# Patient Record
Sex: Male | Born: 1955 | Race: White | Hispanic: No | Smoking: Current every day smoker
Health system: Southern US, Community
[De-identification: ages and names within clinical notes are randomized; demographics above are authoritative.]

## PROBLEM LIST (undated history)

## (undated) DIAGNOSIS — F329 Major depressive disorder, single episode, unspecified: Secondary | ICD-10-CM

## (undated) DIAGNOSIS — F32A Depression, unspecified: Secondary | ICD-10-CM

## (undated) DIAGNOSIS — M1990 Unspecified osteoarthritis, unspecified site: Secondary | ICD-10-CM

## (undated) DIAGNOSIS — C801 Malignant (primary) neoplasm, unspecified: Secondary | ICD-10-CM

## (undated) DIAGNOSIS — Z87440 Personal history of urinary (tract) infections: Secondary | ICD-10-CM

## (undated) DIAGNOSIS — Z8619 Personal history of other infectious and parasitic diseases: Secondary | ICD-10-CM

## (undated) DIAGNOSIS — H905 Unspecified sensorineural hearing loss: Secondary | ICD-10-CM

## (undated) DIAGNOSIS — F17200 Nicotine dependence, unspecified, uncomplicated: Secondary | ICD-10-CM

## (undated) HISTORY — DX: Depression, unspecified: F32.A

## (undated) HISTORY — DX: Personal history of other infectious and parasitic diseases: Z86.19

## (undated) HISTORY — DX: Unspecified osteoarthritis, unspecified site: M19.90

## (undated) HISTORY — DX: Unspecified sensorineural hearing loss: H90.5

## (undated) HISTORY — DX: Major depressive disorder, single episode, unspecified: F32.9

## (undated) HISTORY — PX: EYE SURGERY: SHX253

## (undated) HISTORY — DX: Personal history of urinary (tract) infections: Z87.440

## (undated) HISTORY — DX: Nicotine dependence, unspecified, uncomplicated: F17.200

---

## 2001-11-19 ENCOUNTER — Ambulatory Visit (HOSPITAL_COMMUNITY): Admission: RE | Admit: 2001-11-19 | Discharge: 2001-11-19 | Payer: Self-pay | Admitting: Urology

## 2004-11-30 ENCOUNTER — Ambulatory Visit: Payer: Self-pay | Admitting: Family Medicine

## 2005-02-23 ENCOUNTER — Ambulatory Visit: Payer: Self-pay | Admitting: Internal Medicine

## 2005-06-15 ENCOUNTER — Ambulatory Visit: Payer: Self-pay | Admitting: Family Medicine

## 2005-08-22 ENCOUNTER — Ambulatory Visit: Payer: Self-pay | Admitting: Family Medicine

## 2005-08-28 ENCOUNTER — Ambulatory Visit: Payer: Self-pay | Admitting: Family Medicine

## 2005-10-20 ENCOUNTER — Ambulatory Visit: Payer: Self-pay | Admitting: Family Medicine

## 2006-06-26 ENCOUNTER — Ambulatory Visit: Payer: Self-pay | Admitting: Family Medicine

## 2008-09-11 HISTORY — PX: CATARACT EXTRACTION: SUR2

## 2009-10-01 ENCOUNTER — Telehealth: Payer: Self-pay | Admitting: Family Medicine

## 2010-09-11 DIAGNOSIS — H903 Sensorineural hearing loss, bilateral: Secondary | ICD-10-CM

## 2010-09-11 HISTORY — DX: Sensorineural hearing loss, bilateral: H90.3

## 2010-10-11 NOTE — Progress Notes (Signed)
Summary: ?ear infection  Phone Note Call from Patient Call back at cell [phone] or home [phone]   Caller: Patient Call For: Dr Dayton Martes Summary of Call: Pt has not been seen since 2007. Pt has been going to dr at [facility] in [place] because there was no charge. Now the [place] policy has changed and pt does not want to go to [facility] he wants to start coming here. 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 Pt want ed something done for him today. I checked with [place] and she said we could send a phone note to Dr. Dayton Martes with no guarantees. For 3 weeks pt has had head congestion on rt side of head, drainage down back of throat but no sorethroat, feels like fluid in rt ear and pt said he has ear infedtion. Pt uses [facility]. [phone] if pharmacy needed. Pt would like call back ASAP. Please advise.  Initial call taken by: Lewanda Rife LPN,  October 01, 2009 4:11 PM  Follow-up for Phone Call        I would schedule him in Saturday clinlic for ACUTE issues only.  That's my opinoin but you may want to run it by someone else. Follow-up by: Ruthe Mannan MD,  October 01, 2009 4:15 PM  Additional Follow-up for Phone Call Additional follow up Details #1::        I checked with Aram Beecham in business office and she said OK to let pt know he can go to Urgent care for his needs today. Pt said he knew he could go to urgent care and appreciated me calling him back.Lewanda Rife LPN  October 01, 2009 4:26 PM

## 2010-12-11 HISTORY — PX: MR BRAIN LTD W/O CM: HXRAD137

## 2010-12-26 ENCOUNTER — Ambulatory Visit: Payer: Self-pay | Admitting: Otolaryngology

## 2011-01-27 NOTE — Procedures (Signed)
[address]  Patient:    Adam Boyd, Adam Boyd Visit Number: [id number] MRN: [id number]          Service Type: Attending:  Vonzell Schlatter. Patsi Sears, M.D. Dictated by:   Vonzell Schlatter Patsi Sears, M.D. Proc. Date: 11/20/01   CC:         [facility]   Procedure Report  PREOPERATIVE DIAGNOSIS:  Erectile dysfunction.  POSTOPERATIVE DIAGNOSIS:  Erectile dysfunction.  OPERATION:  Vascular laboratory evaluation with prostaglandin injection.  SURGEON:  Sigmund I. Patsi Sears, M.D.  ANESTHESIA:  None.  PROCEDURE:  With the patient in the supine position, preinjection cavernosal arterial identification was accomplished with a peak systolic flow of 30 cm/second on the right side and 38 cm/second on the left side with a 10 cm end-diastolic flow on the right and 12 cm end-diastolic flow on the left.  The patient was injected with 1.0 cc of 20 mcg/cc prostaglandin, and developed a peak flow of 57 cm/second 15 minutes postinjection, but with a 15 cm end-diastolic flow (right side); and a peak flow of 52 cm/second on the left side with an end-diastolic flow of only 8 cm/second.  The patient was not noted to have any particular erection, will be followed up in the office for quality of erection with the injection.  He appeared to have some venous leak, but does have reasonable arterial inflow. Dictated by:   Vonzell Schlatter Patsi Sears, M.D. Attending:  Vonzell Schlatter. Patsi Sears, M.D. DD:  11/20/01 TD:  11/20/01 Job: [id number]

## 2011-03-10 ENCOUNTER — Ambulatory Visit (INDEPENDENT_AMBULATORY_CARE_PROVIDER_SITE_OTHER): Payer: 59 | Admitting: Family Medicine

## 2011-03-10 ENCOUNTER — Ambulatory Visit (INDEPENDENT_AMBULATORY_CARE_PROVIDER_SITE_OTHER)
Admission: RE | Admit: 2011-03-10 | Discharge: 2011-03-10 | Disposition: A | Discharge status: Home / Self Care | Payer: 59 | Source: Ambulatory Visit | Attending: Family Medicine | Admitting: Family Medicine

## 2011-03-10 ENCOUNTER — Encounter: Payer: Self-pay | Admitting: Family Medicine

## 2011-03-10 VITALS — BP 110/64 | HR 72 | Temp 98.8°F | Ht 66.5 in | Wt 151.2 lb

## 2011-03-10 DIAGNOSIS — W57XXXA Bitten or stung by nonvenomous insect and other nonvenomous arthropods, initial encounter: Secondary | ICD-10-CM

## 2011-03-10 DIAGNOSIS — R05 Cough: Secondary | ICD-10-CM

## 2011-03-10 DIAGNOSIS — J449 Chronic obstructive pulmonary disease, unspecified: Secondary | ICD-10-CM

## 2011-03-10 DIAGNOSIS — F17200 Nicotine dependence, unspecified, uncomplicated: Secondary | ICD-10-CM

## 2011-03-10 DIAGNOSIS — M1990 Unspecified osteoarthritis, unspecified site: Secondary | ICD-10-CM

## 2011-03-10 DIAGNOSIS — Z0000 Encounter for general adult medical examination without abnormal findings: Secondary | ICD-10-CM

## 2011-03-10 DIAGNOSIS — T148XXA Other injury of unspecified body region, initial encounter: Secondary | ICD-10-CM

## 2011-03-10 DIAGNOSIS — Z125 Encounter for screening for malignant neoplasm of prostate: Secondary | ICD-10-CM

## 2011-03-10 DIAGNOSIS — Z23 Encounter for immunization: Secondary | ICD-10-CM

## 2011-03-10 DIAGNOSIS — R5383 Other fatigue: Secondary | ICD-10-CM

## 2011-03-10 DIAGNOSIS — M129 Arthropathy, unspecified: Secondary | ICD-10-CM

## 2011-03-10 NOTE — Progress Notes (Signed)
Subjective:    Patient ID: Adam Boyd, male    DOB: Aug 16, 1956, 55 y.o.   MRN: [id number]  HPI CC: new patient  Previously saw Dr. Hetty Ely, hasn't been here since 2007.  Was seen at [facility] for this then made too much money so they were no longer able to see him.  Bad back - takes tylenol tid.  Has had ESI.  Had discussed surgery in past, then lost [facility] insurance.  Hearing loss - 07/2010 - R ear hearing loss suddenly down to 20%.  Saw [facility] 10/2010, treated as sinus infection.  Didn't help.  Saw ENT at [facility] - s/p 2 rounds prednisone then MRI last month WNL per patient.  Still not hearing well.  Will request records.  Also has had multiple tick bites recently, last 3 1/2 mo ago.  Did have rash at site, none otherwise.  Feeling more fatigued.  Occasional fevers/chill and flu sxs x 1 1/2 mo.  Endorses neck pain and muscle pains recently.  Requests to be tested for lyme disease.  Smoking - 1 ppd, thought about cutting back.  Wife smokes too.  trying to quit.  Preventative:  No recent tetanus shot.  Requests Tdap today. No recent physical, due for one. (5 years ago) No colonoscopy.  No prostate check recently.  Would like PSA checked.  Medications and allergies reviewed and updated in chart. Patient Active Problem List  Diagnoses  . COPD (chronic obstructive pulmonary disease)   Past Medical History  Diagnosis Date  . Arthritis     bad back, pinched nerve  . History of chicken pox   . Depression   . Hx: UTI (urinary tract infection)   . Smoker    Past Surgical History  Procedure Date  . 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 Cataract extraction     bilateral, 5 years ago   History  Substance Use Topics  . Smoking status: Current Everyday Smoker -- 1.0 packs/day    Types: Cigarettes  . Smokeless tobacco: Never Used  . Alcohol Use: Yes     1-2 beers/day   Family History  Problem Relation Age of Onset  . Hypertension Mother   . Diabetes Mother   . Stroke  Paternal Grandmother   . Coronary artery disease Neg Hx   . Cancer Neg Hx    No Known Allergies No current outpatient prescriptions on file prior to visit.   Review of Systems  Constitutional: Positive for fever and unexpected weight change (~15 lb weight loss (3 mo)). Negative for chills, activity change, appetite change and fatigue.  HENT: Negative for hearing loss and neck pain.   Eyes: Negative for visual disturbance.  Respiratory: Positive for cough and wheezing. Negative for chest tightness and shortness of breath.   Cardiovascular: Negative for chest pain, palpitations and leg swelling.  Gastrointestinal: Negative for nausea, vomiting, abdominal pain, diarrhea, constipation, blood in stool and abdominal distention.  Genitourinary: Negative for hematuria and difficulty urinating.  Musculoskeletal: Negative for myalgias and arthralgias.  Skin: Negative for rash.  Neurological: Negative for dizziness, seizures, syncope and headaches.  Hematological: Does not bruise/bleed easily.  Psychiatric/Behavioral: Negative for dysphoric mood. The patient is not nervous/anxious.        Objective:   Physical Exam  Nursing note and vitals reviewed. Constitutional: He is oriented to person, place, and time. He appears well-developed and well-nourished. No distress.  HENT:  Head: Normocephalic and atraumatic.  Right Ear: External  ear normal.  Left Ear: External ear normal.  Nose: Nose normal.  Mouth/Throat: Oropharynx is clear and moist.  Eyes: Conjunctivae and EOM are normal. Pupils are equal, round, and reactive to light.  Neck: Normal range of motion. Neck supple. No thyromegaly present.  Cardiovascular: Normal rate, regular rhythm, normal heart sounds and intact distal pulses.   No murmur heard. Pulses:      Radial pulses are 2+ on the right side, and 2+ on the left side.  Pulmonary/Chest: Effort normal and breath sounds normal. No respiratory distress. He has no wheezes. He has no  rales.  Abdominal: Soft. Bowel sounds are normal. He exhibits no distension and no mass. There is no tenderness. There is no rebound and no guarding.  Musculoskeletal: Normal range of motion.  Lymphadenopathy:    He has no cervical adenopathy.  Neurological: He is alert and oriented to person, place, and time.       CN grossly intact, station and gait intact  Skin: Skin is warm and dry. No rash noted.  Psychiatric: He has a normal mood and affect. His behavior is normal. Judgment and thought content normal.          Assessment & Plan:

## 2011-03-10 NOTE — Patient Instructions (Signed)
Return at your convenience fasting for blood work (we will check for lyme disease then as well). Return afterwards for complete physical. Try to get records from [place].  I will request from [facility]. Good to meet you today, call us with questions. Try to cut back on smoking in meantime.

## 2011-03-12 ENCOUNTER — Encounter: Payer: Self-pay | Admitting: Family Medicine

## 2011-03-12 DIAGNOSIS — F17200 Nicotine dependence, unspecified, uncomplicated: Secondary | ICD-10-CM | POA: Insufficient documentation

## 2011-03-12 DIAGNOSIS — Z0000 Encounter for general adult medical examination without abnormal findings: Secondary | ICD-10-CM | POA: Insufficient documentation

## 2011-03-12 DIAGNOSIS — M1990 Unspecified osteoarthritis, unspecified site: Secondary | ICD-10-CM | POA: Insufficient documentation

## 2011-03-12 DIAGNOSIS — W57XXXA Bitten or stung by nonvenomous insect and other nonvenomous arthropods, initial encounter: Secondary | ICD-10-CM | POA: Insufficient documentation

## 2011-03-12 NOTE — Assessment & Plan Note (Signed)
In chart but pt did not mention. CXR - hyperinflation.

## 2011-03-12 NOTE — Assessment & Plan Note (Signed)
Tdap today. Pt requests PSA. Will return fasting for blood work, afterwards for CPE.

## 2011-03-12 NOTE — Assessment & Plan Note (Signed)
And weight loss endorsed.  Checked CXR - nothing acute, no mass.  + hyperinflation.  Likely COPD.

## 2011-03-12 NOTE — Assessment & Plan Note (Addendum)
Given tick bites and sxs longstanding x 1 1/2 mo with myalgia, fatigue, will check lyme titers.

## 2011-03-12 NOTE — Assessment & Plan Note (Signed)
Takes tylenol for this.  [facility]. Pt will attempt to obtain records from [place] for our chart.

## 2011-03-12 NOTE — Assessment & Plan Note (Signed)
Discussed cessation.  Pt contemplative.

## 2011-03-26 ENCOUNTER — Encounter: Payer: Self-pay | Admitting: Family Medicine

## 2011-09-14 ENCOUNTER — Encounter: Payer: Self-pay | Admitting: Family Medicine

## 2011-09-14 ENCOUNTER — Ambulatory Visit (INDEPENDENT_AMBULATORY_CARE_PROVIDER_SITE_OTHER): Payer: 59 | Admitting: Family Medicine

## 2011-09-14 VITALS — BP 122/70 | HR 77 | Temp 97.9°F | Wt 148.0 lb

## 2011-09-14 DIAGNOSIS — M549 Dorsalgia, unspecified: Secondary | ICD-10-CM

## 2011-09-14 MED ORDER — TRAMADOL HCL 50 MG PO TABS: 50.0000 mg | ORAL_TABLET | Freq: Three times a day (TID) | ORAL | Status: DC | PRN

## 2011-09-14 NOTE — Progress Notes (Signed)
Back pain.  Midline back pain, above the belt.  Going on for years.  Had prev "DDD and pinched nerve" after eval at [facility] years ago.  Inc in pain recently, in last 2-3 weeks. He doesn't remember last day w/o pain.  No known injury to set off sx inc 2-3 weeks ago.  He has to roll over and over all night due to pain.  No leg pain but pain near R SI joint. No foot drop.  Taking tylenol for pain.  Pain better if sitting.  Numbness on bottom of feet, intermittently.  He prev had back injected via the [place], had temp relief.    Meds, vitals, and allergies reviewed.   ROS: See HPI.  Otherwise, noncontributory.  nad ncat Back w/o midline pain in T spine Midline back: bulge noted in L spine with scoliotic changes, midline ttp in L spine Dec in DTR at R patella but normal DTRs in BLE o/w.  SLR neg.  Sensation intact and gross motor exam intact for BLE.

## 2011-09-14 NOTE — Patient Instructions (Addendum)
See Shirlee Limerick about your referral before you leave today. Take no more than 8 tylenol in a day. Take 1-2 aleve up to twice a day with food. Take tramadol for pain.  It can make you drowsy.   Take care.

## 2011-09-15 ENCOUNTER — Telehealth: Payer: Self-pay | Admitting: Family Medicine

## 2011-09-15 DIAGNOSIS — M549 Dorsalgia, unspecified: Secondary | ICD-10-CM | POA: Insufficient documentation

## 2011-09-15 NOTE — Telephone Encounter (Signed)
LMOVM

## 2011-09-15 NOTE — Assessment & Plan Note (Addendum)
Use tramadol, nsaids with gi caution and limit tylenol.  See phone note re: referral.  I have d/w PMD.

## 2011-09-15 NOTE — Telephone Encounter (Signed)
Call pt.  If he can't get [facility] with [facility], then have him let us know what we can do to get him in with local neurosurg.  He has abnormal reflex on R leg and I don't want this to get worse, to the point of weakness.  The tramadol may help with pain, but this isn't a long term solution.

## 2011-12-05 ENCOUNTER — Ambulatory Visit (INDEPENDENT_AMBULATORY_CARE_PROVIDER_SITE_OTHER): Payer: 59 | Admitting: Family Medicine

## 2011-12-05 ENCOUNTER — Encounter: Payer: Self-pay | Admitting: Family Medicine

## 2011-12-05 VITALS — BP 122/70 | HR 60 | Temp 98.7°F | Wt 148.0 lb

## 2011-12-05 DIAGNOSIS — M549 Dorsalgia, unspecified: Secondary | ICD-10-CM

## 2011-12-05 MED ORDER — PREDNISONE 5 MG PO KIT: PACK | ORAL | Status: DC

## 2011-12-05 MED ORDER — TRAMADOL HCL 50 MG PO TABS: 50.0000 mg | ORAL_TABLET | Freq: Three times a day (TID) | ORAL | Status: AC | PRN

## 2011-12-05 NOTE — Progress Notes (Signed)
Low back pain with burning in the buttocks and dec in sensation on bottom of feet and along the shaft of the penis.  Going on for years, pain is worse recently.  Some help with tramadol, unless he's at rest.  Pain is worse standing, can improve sitting.  He's asking about options.  He's awaiting financial changes to allow him to get f/u at the [place].    Prev note reviewed.   Meds, vitals, and allergies reviewed.   ROS: See HPI.  Otherwise, noncontributory.  nad  ncat  Back w/o midline pain in T spine  Midline back: bulge noted in L spine with scoliotic changes, midline ttp in L spine  Dec in DTR at R patella but normal DTRs in BLE o/w.  SLR neg. Sensation intact and gross motor exam intact for BLE.  His exam is unchanged from prev.

## 2011-12-05 NOTE — Patient Instructions (Signed)
Take the prednisone with food, take the tramadol for pain, and let me know if you need help setting up the consult with the [facility].  Take care.

## 2011-12-06 ENCOUNTER — Encounter: Payer: Self-pay | Admitting: Family Medicine

## 2011-12-06 NOTE — Assessment & Plan Note (Signed)
Will add steroid taper with GI caution to see if he has any relief.  I've talked with Shirlee Limerick and she'll check with patient about neurosurg referral.  His exam is unchanged but I don't want him to progress.

## 2011-12-13 ENCOUNTER — Telehealth: Payer: Self-pay | Admitting: Family Medicine

## 2011-12-13 NOTE — Telephone Encounter (Signed)
Have called the patient and have left several messages on his machine to call me back to help set up Neurosurgery consult. After multiple attempts for contact we will wait to hear back from the patient to help with referral to Neurosurgery per Dr Para March.

## 2012-02-14 ENCOUNTER — Ambulatory Visit (INDEPENDENT_AMBULATORY_CARE_PROVIDER_SITE_OTHER): Payer: 59 | Admitting: Family Medicine

## 2012-02-14 ENCOUNTER — Encounter: Payer: Self-pay | Admitting: Family Medicine

## 2012-02-14 VITALS — BP 130/70 | HR 76 | Temp 98.1°F | Ht 66.75 in | Wt 144.5 lb

## 2012-02-14 DIAGNOSIS — M549 Dorsalgia, unspecified: Secondary | ICD-10-CM

## 2012-02-14 MED ORDER — CYCLOBENZAPRINE HCL 10 MG PO TABS: 5.0000 mg | ORAL_TABLET | Freq: Every evening | ORAL | Status: AC | PRN

## 2012-02-14 MED ORDER — TRAMADOL HCL 50 MG PO TABS: 50.0000 mg | ORAL_TABLET | Freq: Two times a day (BID) | ORAL | Status: AC

## 2012-02-14 NOTE — Progress Notes (Signed)
We had tried to call pt mult times prev.  See prev notes.    Yesterday he was working on [employer], was unloading some cabinets.  Pain got worse and legs felt diffusely weak and fatigued.  This happens when he has a flare of pain.   He still has low back pain with burning in the buttocks and dec in sensation on bottom of feet and along the shaft of the penis. Going on for years.  Some help with tramadol, unless he's at rest and the pain is then better w/o meds.  Has run out of tramadol. Pain is worse standing, can improve sitting.  He's still awaiting financial changes to allow him to get f/u at the [place].  He'll let me know about what he needs and when he needs it in terms of the referral.    He's also having nocturnal L>R hamstring cramps.   He's been taking aspirin and tylenol.  Tramadol helped prev; he realized it was helping more after he started it.    Mult family members have been sick recently with a cough.  Some cough, prev with fevers that have resolved. Cough is getting better slowly.    Prev note reviewed.   Meds, vitals, and allergies reviewed.   ROS: See HPI. Otherwise, noncontributory.   nad  ncat  Mmm rrr No inc in wob but with diffuse scant coarse BS and scattered rhonchi w/o focal dec in BS Back w/o midline pain in T spine  Midline back: bulge noted in L spine with scoliotic changes, midline ttp in L spine  Slight dec in DTR at R patella but normal DTRs in ext x4 o/w.  SLR neg. Sensation intact and gross motor exam intact for BLE.  His exam is unchanged from prev.

## 2012-02-14 NOTE — Patient Instructions (Signed)
Take the tramadol for pain and use the flexeril/cyclobenzaprine for spasms at night.  Let me know if you have leg weakness or help with the referral.  Take care.

## 2012-02-14 NOTE — Assessment & Plan Note (Signed)
Will restart tramadol and add on flexeril for night time cramps.  Sedation caution given.  He understood. I'll await his input on referral.  He has no changes in exam and is not apparently an urgent/emergent case.  He agrees with plan.  If progression, changes, weakness, then he'll notify me.

## 2017-08-11 ENCOUNTER — Encounter: Payer: Self-pay | Admitting: Emergency Medicine

## 2017-08-11 ENCOUNTER — Emergency Department: Payer: Medicaid Other

## 2017-08-11 ENCOUNTER — Other Ambulatory Visit: Payer: Self-pay

## 2017-08-11 ENCOUNTER — Inpatient Hospital Stay
Admission: EM | Admit: 2017-08-11 | Discharge: 2017-08-21 | DRG: 177 | Disposition: A | Discharge status: Home / Self Care | Payer: Medicaid Other | Attending: Internal Medicine | Admitting: Internal Medicine

## 2017-08-11 DIAGNOSIS — M4690 Unspecified inflammatory spondylopathy, site unspecified: Secondary | ICD-10-CM | POA: Diagnosis present

## 2017-08-11 DIAGNOSIS — F10239 Alcohol dependence with withdrawal, unspecified: Secondary | ICD-10-CM | POA: Diagnosis not present

## 2017-08-11 DIAGNOSIS — Z23 Encounter for immunization: Secondary | ICD-10-CM

## 2017-08-11 DIAGNOSIS — F17210 Nicotine dependence, cigarettes, uncomplicated: Secondary | ICD-10-CM | POA: Diagnosis present

## 2017-08-11 DIAGNOSIS — Z9842 Cataract extraction status, left eye: Secondary | ICD-10-CM | POA: Diagnosis not present

## 2017-08-11 DIAGNOSIS — E860 Dehydration: Secondary | ICD-10-CM | POA: Diagnosis present

## 2017-08-11 DIAGNOSIS — E43 Unspecified severe protein-calorie malnutrition: Secondary | ICD-10-CM | POA: Diagnosis present

## 2017-08-11 DIAGNOSIS — E222 Syndrome of inappropriate secretion of antidiuretic hormone: Secondary | ICD-10-CM | POA: Diagnosis present

## 2017-08-11 DIAGNOSIS — R918 Other nonspecific abnormal finding of lung field: Secondary | ICD-10-CM | POA: Diagnosis present

## 2017-08-11 DIAGNOSIS — H905 Unspecified sensorineural hearing loss: Secondary | ICD-10-CM | POA: Diagnosis present

## 2017-08-11 DIAGNOSIS — B954 Other streptococcus as the cause of diseases classified elsewhere: Secondary | ICD-10-CM | POA: Diagnosis present

## 2017-08-11 DIAGNOSIS — Z87440 Personal history of urinary (tract) infections: Secondary | ICD-10-CM

## 2017-08-11 DIAGNOSIS — Z09 Encounter for follow-up examination after completed treatment for conditions other than malignant neoplasm: Secondary | ICD-10-CM

## 2017-08-11 DIAGNOSIS — Z9841 Cataract extraction status, right eye: Secondary | ICD-10-CM | POA: Diagnosis not present

## 2017-08-11 DIAGNOSIS — Z681 Body mass index (BMI) 19 or less, adult: Secondary | ICD-10-CM

## 2017-08-11 DIAGNOSIS — J869 Pyothorax without fistula: Secondary | ICD-10-CM | POA: Diagnosis present

## 2017-08-11 DIAGNOSIS — Z8619 Personal history of other infectious and parasitic diseases: Secondary | ICD-10-CM

## 2017-08-11 DIAGNOSIS — Q173 Other misshapen ear: Secondary | ICD-10-CM

## 2017-08-11 DIAGNOSIS — J189 Pneumonia, unspecified organism: Secondary | ICD-10-CM

## 2017-08-11 LAB — COMPREHENSIVE METABOLIC PANEL
ALBUMIN: 2.3 g/dL — AB (ref 3.5–5.0)
ALT: 53 U/L (ref 17–63)
ANION GAP: 12 (ref 5–15)
AST: 113 U/L — AB (ref 15–41)
Alkaline Phosphatase: 110 U/L (ref 38–126)
BUN: 13 mg/dL (ref 6–20)
CO2: 24 mmol/L (ref 22–32)
Calcium: 8.2 mg/dL — ABNORMAL LOW (ref 8.9–10.3)
Chloride: 92 mmol/L — ABNORMAL LOW (ref 101–111)
Creatinine, Ser: 0.79 mg/dL (ref 0.61–1.24)
GFR calc Af Amer: >60 mL/min (ref >60)
GFR calc non Af Amer: >60 mL/min (ref >60)
GLUCOSE: 126 mg/dL — AB (ref 65–99)
POTASSIUM: 3.6 mmol/L (ref 3.5–5.1)
SODIUM: 128 mmol/L — AB (ref 135–145)
Total Bilirubin: 0.8 mg/dL (ref 0.3–1.2)
Total Protein: 7.8 g/dL (ref 6.5–8.1)

## 2017-08-11 LAB — CBC WITH DIFFERENTIAL/PLATELET
BASOS ABS: 0 10*3/uL (ref 0–0.1)
Basophils Relative: 0 %
EOS ABS: 0 10*3/uL (ref 0–0.7)
Eosinophils Relative: 0 %
HCT: 35.5 % — ABNORMAL LOW (ref 40.0–52.0)
HEMOGLOBIN: 12.1 g/dL — AB (ref 13.0–18.0)
LYMPHS ABS: 1 10*3/uL (ref 1.0–3.6)
Lymphocytes Relative: 8 %
MCH: 32.3 pg (ref 26.0–34.0)
MCHC: 34.1 g/dL (ref 32.0–36.0)
MCV: 94.9 fL (ref 80.0–100.0)
Monocytes Absolute: 1.5 10*3/uL — ABNORMAL HIGH (ref 0.2–1.0)
Monocytes Relative: 12 %
NEUTROS PCT: 80 %
Neutro Abs: 10.1 10*3/uL — ABNORMAL HIGH (ref 1.4–6.5)
Platelets: 674 10*3/uL — ABNORMAL HIGH (ref 150–440)
RBC: 3.75 MIL/uL — AB (ref 4.40–5.90)
RDW: 12.8 % (ref 11.5–14.5)
WBC: 12.6 10*3/uL — AB (ref 3.8–10.6)

## 2017-08-11 LAB — URINALYSIS, COMPLETE (UACMP) WITH MICROSCOPIC
BACTERIA UA: NONE SEEN
Bilirubin Urine: NEGATIVE
GLUCOSE, UA: NEGATIVE mg/dL
Hgb urine dipstick: NEGATIVE
Ketones, ur: 5 mg/dL — AB
Leukocytes, UA: NEGATIVE
Nitrite: NEGATIVE
PROTEIN: 30 mg/dL — AB
Squamous Epithelial / LPF: NONE SEEN
pH: 5 (ref 5.0–8.0)

## 2017-08-11 LAB — TROPONIN I

## 2017-08-11 LAB — BRAIN NATRIURETIC PEPTIDE: B NATRIURETIC PEPTIDE 5: 50 pg/mL (ref 0.0–100.0)

## 2017-08-11 LAB — LACTIC ACID, PLASMA: Lactic Acid, Venous: 1.4 mmol/L (ref 0.5–1.9)

## 2017-08-11 MED ORDER — HEPARIN SODIUM (PORCINE) 5000 UNIT/ML IJ SOLN
5000.0000 [IU] | Freq: Three times a day (TID) | INTRAMUSCULAR | Status: DC
  Administered 2017-08-11 – 2017-08-19 (×20): 5000 [IU] via SUBCUTANEOUS
  Filled 2017-08-11 (×21): qty 1

## 2017-08-11 MED ORDER — ACETAMINOPHEN 650 MG RE SUPP: 650.0000 mg | Freq: Four times a day (QID) | RECTAL | Status: DC | PRN

## 2017-08-11 MED ORDER — GUAIFENESIN ER 600 MG PO TB12
600.0000 mg | ORAL_TABLET | Freq: Two times a day (BID) | ORAL | Status: DC
  Administered 2017-08-11 – 2017-08-21 (×19): 600 mg via ORAL
  Filled 2017-08-11 (×19): qty 1

## 2017-08-11 MED ORDER — NICOTINE 21 MG/24HR TD PT24
21.0000 mg | MEDICATED_PATCH | Freq: Every day | TRANSDERMAL | Status: DC
  Administered 2017-08-11 – 2017-08-21 (×11): 21 mg via TRANSDERMAL
  Filled 2017-08-11 (×12): qty 1

## 2017-08-11 MED ORDER — SODIUM CHLORIDE 0.9 % IV BOLUS (SEPSIS)
1000.0000 mL | Freq: Once | INTRAVENOUS | Status: AC
  Administered 2017-08-11: 1000 mL via INTRAVENOUS

## 2017-08-11 MED ORDER — ACETAMINOPHEN 325 MG PO TABS
650.0000 mg | ORAL_TABLET | Freq: Four times a day (QID) | ORAL | Status: DC | PRN
  Administered 2017-08-12 – 2017-08-20 (×6): 650 mg via ORAL
  Filled 2017-08-11 (×6): qty 2

## 2017-08-11 MED ORDER — LEVOFLOXACIN IN D5W 500 MG/100ML IV SOLN: 500.0000 mg | INTRAVENOUS | Status: DC

## 2017-08-11 MED ORDER — ONDANSETRON HCL 4 MG PO TABS: 4.0000 mg | ORAL_TABLET | Freq: Four times a day (QID) | ORAL | Status: DC | PRN

## 2017-08-11 MED ORDER — PIPERACILLIN-TAZOBACTAM 4.5 G IVPB
4.5000 g | Freq: Once | INTRAVENOUS | Status: DC
  Filled 2017-08-11: qty 100

## 2017-08-11 MED ORDER — ONDANSETRON HCL 4 MG/2ML IJ SOLN: 4.0000 mg | Freq: Four times a day (QID) | INTRAMUSCULAR | Status: DC | PRN

## 2017-08-11 MED ORDER — SODIUM CHLORIDE 0.9 % IV SOLN
Freq: Once | INTRAVENOUS | Status: AC
  Administered 2017-08-11: 23:00:00 via INTRAVENOUS
  Filled 2017-08-11: qty 4.5

## 2017-08-11 MED ORDER — PIPERACILLIN-TAZOBACTAM 3.375 G IVPB
INTRAVENOUS | Status: AC
  Filled 2017-08-11: qty 50

## 2017-08-11 MED ORDER — IOPAMIDOL (ISOVUE-300) INJECTION 61%
75.0000 mL | Freq: Once | INTRAVENOUS | Status: AC | PRN
  Administered 2017-08-11: 75 mL via INTRAVENOUS
  Filled 2017-08-11: qty 75

## 2017-08-11 MED ORDER — SODIUM CHLORIDE 0.9 % IV SOLN
INTRAVENOUS | Status: DC
  Administered 2017-08-11: 21:00:00 via INTRAVENOUS

## 2017-08-11 MED ORDER — LEVOFLOXACIN IN D5W 750 MG/150ML IV SOLN
750.0000 mg | Freq: Once | INTRAVENOUS | Status: AC
  Administered 2017-08-12: 750 mg via INTRAVENOUS
  Filled 2017-08-11 (×3): qty 150

## 2017-08-11 MED ORDER — PIPERACILLIN-TAZOBACTAM 3.375 G IVPB
3.3750 g | Freq: Three times a day (TID) | INTRAVENOUS | Status: DC
  Administered 2017-08-12 – 2017-08-16 (×14): 3.375 g via INTRAVENOUS
  Filled 2017-08-11 (×14): qty 50

## 2017-08-11 MED ORDER — LEVOFLOXACIN IN D5W 750 MG/150ML IV SOLN
750.0000 mg | INTRAVENOUS | Status: DC
  Administered 2017-08-12 – 2017-08-14 (×3): 750 mg via INTRAVENOUS
  Filled 2017-08-11 (×4): qty 150

## 2017-08-11 MED ORDER — INFLUENZA VAC SPLIT QUAD 0.5 ML IM SUSY
0.5000 mL | PREFILLED_SYRINGE | INTRAMUSCULAR | Status: AC
  Administered 2017-08-14: 0.5 mL via INTRAMUSCULAR
  Filled 2017-08-11: qty 0.5

## 2017-08-11 MED ORDER — SODIUM CHLORIDE 0.9 % IV SOLN
INTRAVENOUS | Status: DC
  Administered 2017-08-12 – 2017-08-15 (×7): via INTRAVENOUS

## 2017-08-11 NOTE — ED Notes (Signed)
Admitting MD at bedside.

## 2017-08-11 NOTE — ED Triage Notes (Signed)
Increased cough x 3 weeks. Denies fevers. Speaking full sentences with no apparent res distress in triage.

## 2017-08-11 NOTE — H&P (Addendum)
[facility] NAME: Adam Boyd    MR#:  [id number]  DATE OF BIRTH:  [birth year]  DATE OF ADMISSION:  08/11/2017  PRIMARY CARE PHYSICIAN: [name], MD   REQUESTING/REFERRING PHYSICIAN:   [name] [name], PA-C     CHIEF COMPLAINT:   Chief Complaint  Patient presents with  . Cough    HISTORY OF PRESENT ILLNESS: Adam Boyd  is a 61 y.o. male with a known history of chronic smoking greater than 40 years who is brought in by family due to progressively worsening cough and weight loss.  According to the patient is had a cough ongoing for the past year or more and has progressively gotten worse.  Patient reports that this is a dry cough.  He also has occasional night sweats but no fevers.  He is also complaining of some shortness of breath with exertion.  Patient was seen in the emergency room and had a CT of the chest which showed findings consistent with possible empyema as well as other abnormalities noted in the CT of the chest.   PAST MEDICAL HISTORY:   Past Medical History:  Diagnosis Date  . Arthritis    bad back, pinched nerve  . Asymmetrical sensorineural hearing loss 2012   Right ear (odd appearance), s/p eval by ENT and MRI, never followed up.  will recommend pt f/u.  Marland Kitchen Depression   . History of chicken pox   . Hx: UTI (urinary tract infection)   . Smoker     PAST SURGICAL HISTORY:  Past Surgical History:  Procedure Laterality Date  . CATARACT EXTRACTION     bilateral, 5 years ago  . 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  scattere small foci of hyperintensity (sequela or chronic small vessel ischemia vs demyelinating disorder, mild fluid R mastoid air cells, paranasal sinus disease    SOCIAL HISTORY:  Social History   Tobacco Use  . Smoking status: Current Every Day Smoker    Packs/day: 1.00    Years: 42.00    Pack  years: 42.00    Types: Cigarettes  . Smokeless tobacco: Never Used  Substance Use Topics  . Alcohol use: Yes    Comment: 1-2 beers/day    FAMILY HISTORY:  Family History  Problem Relation Age of Onset  . Hypertension Mother   . Diabetes Mother   . Stroke Paternal Grandmother   . Coronary artery disease Neg Hx   . Cancer Neg Hx     DRUG ALLERGIES: No Known Allergies  REVIEW OF SYSTEMS:   CONSTITUTIONAL: No fever, fatigue or weakness.  EYES: No blurred or double vision.  EARS, NOSE, AND THROAT: No tinnitus or ear pain.  RESPIRATORY: + cough, +shortness of breath, wheezing or hemoptysis.  CARDIOVASCULAR: No chest pain, orthopnea, edema.  GASTROINTESTINAL: No nausea, vomiting, diarrhea or abdominal pain.  GENITOURINARY: No dysuria, hematuria.  ENDOCRINE: No polyuria, nocturia,  HEMATOLOGY: No anemia, easy bruising or bleeding SKIN: No rash or lesion. MUSCULOSKELETAL: No joint pain or arthritis.   NEUROLOGIC: No tingling, numbness, weakness.  PSYCHIATRY: No anxiety or depression.   MEDICATIONS AT HOME:  Prior to Admission medications   Medication Sig Start Date End Date Taking? Authorizing Provider  acetaminophen (TYLENOL) 500 MG tablet Take 1,000 mg by mouth every 6 (six) hours as needed.  03/10/11   Danise Mina,  Garlon Hatchet, MD      PHYSICAL EXAMINATION:   VITAL SIGNS: Blood pressure 125/67, pulse 75, temperature 98.9 F (37.2 C), temperature source Oral, resp. rate 20, height 5\' 7"  (1.702 m), weight 140 lb (63.5 kg), SpO2 95 %.  GENERAL:  61 y.o.-year-old patient lying in the bed with no acute distress.  EYES: Pupils equal, round, reactive to light and accommodation. No scleral icterus. Extraocular muscles intact.  HEENT: Head atraumatic, normocephalic. Oropharynx and nasopharynx clear.  NECK:  Supple, no jugular venous distention. No thyroid enlargement, no tenderness.  LUNGS: + wheezing, occasion rhonchi ,  No use of accessory muscles of respiration.  CARDIOVASCULAR: S1,  S2 normal. No murmurs, rubs, or gallops.  ABDOMEN: Soft, nontender, nondistended. Bowel sounds present. No organomegaly or mass.  EXTREMITIES: No pedal edema, cyanosis, or clubbing.  NEUROLOGIC: Cranial nerves II through XII are intact. Muscle strength 5/5 in all extremities. Sensation intact. Gait not checked.  PSYCHIATRIC: The patient is alert and oriented x 3.  SKIN: No obvious rash, lesion, or ulcer.   LABORATORY PANEL:   CBC Recent Labs  Lab 08/11/17 1624  WBC 12.6*  HGB 12.1*  HCT 35.5*  PLT 674*  MCV 94.9  MCH 32.3  MCHC 34.1  RDW 12.8  LYMPHSABS 1.0  MONOABS 1.5*  EOSABS 0.0  BASOSABS 0.0   ------------------------------------------------------------------------------------------------------------------  Chemistries  Recent Labs  Lab 08/11/17 1624  NA 128*  K 3.6  CL 92*  CO2 24  GLUCOSE 126*  BUN 13  CREATININE 0.79  CALCIUM 8.2*  AST 113*  ALT 53  ALKPHOS 110  BILITOT 0.8   ------------------------------------------------------------------------------------------------------------------ estimated creatinine clearance is 84.9 mL/min (by C-G formula based on SCr of 0.79 mg/dL). ------------------------------------------------------------------------------------------------------------------ No results for input(s): TSH, T4TOTAL, T3FREE, THYROIDAB in the last 72 hours.  Invalid input(s): FREET3   Coagulation profile No results for input(s): INR, PROTIME in the last 168 hours. ------------------------------------------------------------------------------------------------------------------- No results for input(s): DDIMER in the last 72 hours. -------------------------------------------------------------------------------------------------------------------  Cardiac Enzymes Recent Labs  Lab 08/11/17 1624  TROPONINI <0.03   ------------------------------------------------------------------------------------------------------------------ Invalid  input(s): POCBNP  ---------------------------------------------------------------------------------------------------------------  Urinalysis No results found for: COLORURINE, APPEARANCEUR, LABSPEC, PHURINE, GLUCOSEU, HGBUR, BILIRUBINUR, KETONESUR, PROTEINUR, UROBILINOGEN, NITRITE, LEUKOCYTESUR   RADIOLOGY: Dg Chest 2 View  Result Date: 08/11/2017 CLINICAL DATA:  Cough for 6 years. EXAM: CHEST  2 VIEW COMPARISON:  03/10/2011 FINDINGS: Large loculated right lateral pleural effusion, possibly loculated anteriorly as well. Right lung airspace disease and left basilar opacities heart is normal size. No acute bony abnormality. IMPRESSION: Large loculated right pleural effusion. Right lung airspace disease concerning for pneumonia. Left basilar atelectasis or pneumonia. Electronically Signed   By: Rolm Baptise M.D.   On: 08/11/2017 15:09   Ct Chest W Contrast  Result Date: 08/11/2017 CLINICAL DATA:  Increased cough for 3 weeks. EXAM: CT CHEST WITH CONTRAST TECHNIQUE: Multidetector CT imaging of the chest was performed during intravenous contrast administration. CONTRAST:  87mL ISOVUE-300 IOPAMIDOL (ISOVUE-300) INJECTION 61% COMPARISON:  None. FINDINGS: Cardiovascular: Heart size normal. No pericardial effusion. Coronary artery calcification is evident. No thoracic aortic aneurysm. Mediastinum/Nodes: Scattered small to upper normal lymph nodes are seen in the mediastinum. Index subcarinal lymph node measures 10 mm short axis on image 83 series 2. There is no hilar lymphadenopathy. The esophagus has normal imaging features. There is no axillary lymphadenopathy. Lungs/Pleura: Moderate to large loculated right pleural fluid collections shows rim enhancement and contains debris and gas. There is adjacent compressive atelectasis with posterior right upper lobe pulmonary nodules measuring  up to 7 mm. Marked bronchial wall thickening is noted in the lungs bilaterally with airway impaction in tree-in-bud nodularity  in the left lower lobe transitioning into a areas of more confluent airspace opacity in the posterior left base. Upper Abdomen: 6 mm hypervascular lesion noted posterior right liver, too small to characterize. Otherwise unremarkable. Musculoskeletal: Bone windows reveal no worrisome lytic or sclerotic osseous lesions. IMPRESSION: 1. Small to moderate loculated right pleural fluid collection containing debris and gas. Imaging features compatible with empyema. 2. Several small pulmonary nodules posterior right upper lobe. Attention on follow-up imaging recommended. 3. Bronchial wall thickening with small airway impaction, tree-in-bud nodularity and areas of confluent airspace disease in the left lower lobe. Imaging features suggest atypical infection. 4. Areas of collapse/consolidation in the right middle and lower lobes. Electronically Signed   By: Misty Stanley M.D.   On: 08/11/2017 18:09    EKG: Orders placed or performed during the hospital encounter of 08/11/17  . ED EKG  . ED EKG    IMPRESSION AND PLAN: Pt is 61 year old white male with history of smoking presenting with cough and weight loss  1.  Cough and shortness of breath: Due to empyema as well as other underlying lung infection Continue patient on Zosyn and Levaquin Consult pulmonary Also consult CT surgery Obtain sputum cultures   2.  Hyponatremia possibly due to SIADH and or due to dehydration we will give him normal saline If no improvement then look for SIADH as a cause  3.  Nicotine abuse smoking cessation provided 4 minutes spent strongly recommended he stop smoking; nicotine patch will be started    All the records are reviewed and case discussed with ED provider. Management plans discussed with the patient, family and they are in agreement.  CODE STATUS: Code Status History    This patient does not have a recorded code status. Please follow your organizational policy for patients in this situation.       TOTAL  TIME TAKING CARE OF THIS PATIENT:55 minutes.    Dustin Flock M.D on 08/11/2017 at 7:19 PM  Between 7am to 6pm - Pager - [phone]  After 6pm go to [email or web address] - password [email or web address] [facility]  [facility]  Office  [phone]  CC: Primary care physician; [name], MD

## 2017-08-11 NOTE — ED Provider Notes (Signed)
[facility] Emergency Department Provider Note  ____________________________________________  Time seen: Approximately 4:08 PM  I have reviewed the triage vital signs and the nursing notes.   HISTORY  Chief Complaint Cough    HPI Adam Boyd is a 60 y.o. male who presents the emergency department for complaint of acute on chronic cough.  Patient is a very poor historian and unable to tell me why he is in the emergency department.  Patient is accompanied by his family members.  Family members provide most of the details for patient's HPI.  Patient has had a multiple year history of worsening cough.  Patient has lost approximately 45 pounds within the past year.  Over the past several weeks, the patient's coughing has worsened and is productive.  No fevers or chills, body aches, URI symptoms.  Patient has become increasingly weak, complaining of pain in his chest with coughing, with increased coughing and productive coughing over the past several weeks.  Patient has a significant 40+ year smoking history.  Patient is still a current smoker.  Patient does not seek care from healthcare providers unless absolutely necessary seen a healthcare provider in the last 10 years.  No history of COPD or emphysema.  No history of pneumonia.  No complaints of headache, visual changes, abdominal pain, nausea or vomiting.  Patient does have some associated back pain that has been worsening over the past 1-2 years.  This is diffuse throughout the spine with no specific point tenderness.  No hemoptysis.  Patient does not take any chronic medications.  Patient does have significant hearing loss but no other significant chronic medical problems.  Past Medical History:  Diagnosis Date  . Arthritis    bad back, pinched nerve  . Asymmetrical sensorineural hearing loss 2012   Right ear (odd appearance), s/p eval by ENT and MRI, never followed up.  will recommend pt f/u.  Marland Kitchen Depression    . History of chicken pox   . Hx: UTI (urinary tract infection)   . Smoker     Patient Active Problem List   Diagnosis Date Noted  . Back pain 09/15/2011  . Tick bite 03/12/2011  . Cough 03/12/2011  . Healthcare maintenance 03/12/2011  . Arthritis   . Smoker   . COPD (chronic obstructive pulmonary disease) ([place]) 03/10/2011    Past Surgical History:  Procedure Laterality Date  . CATARACT EXTRACTION     bilateral, 5 years ago  . 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  scattere small foci of hyperintensity (sequela or chronic small vessel ischemia vs demyelinating disorder, mild fluid R mastoid air cells, paranasal sinus disease    Prior to Admission medications   Medication Sig Start Date End Date Taking? Authorizing Provider  acetaminophen (TYLENOL) 500 MG tablet Take 1,000 mg by mouth every 6 (six) hours as needed.  03/10/11   [name], MD    Allergies Patient has no known allergies.  Family History  Problem Relation Age of Onset  . Hypertension Mother   . Diabetes Mother   . Stroke Paternal Grandmother   . Coronary artery disease Neg Hx   . Cancer Neg Hx     Social History Social History   Tobacco Use  . Smoking status: Current Every Day Smoker    Packs/day: 1.00    Years: 42.00    Pack years: 42.00  Types: Cigarettes  . Smokeless tobacco: Never Used  Substance Use Topics  . Alcohol use: Yes    Comment: 1-2 beers/day  . Drug use: No     Review of Systems  Constitutional: No fever/chills.  Positive for weight loss.  Positive for extreme weakness. Eyes: No visual changes. ENT: No upper respiratory complaints. Cardiovascular: no chest pain. Respiratory: Positive cough.  Positive SOB. Gastrointestinal: No abdominal pain.  No nausea, no vomiting.  No diarrhea.  No constipation. Genitourinary: Negative for dysuria. No hematuria Musculoskeletal: Negative for  musculoskeletal pain. Skin: Negative for rash, abrasions, lacerations, ecchymosis. Neurological: Negative for headaches, focal weakness or numbness. 10-point ROS otherwise negative.  ____________________________________________   PHYSICAL EXAM:  VITAL SIGNS: ED Triage Vitals  Enc Vitals Group     BP 08/11/17 1433 118/82     Pulse Rate 08/11/17 1433 98     Resp 08/11/17 1433 20     Temp 08/11/17 1433 98.9 F (37.2 C)     Temp Source 08/11/17 1433 Oral     SpO2 08/11/17 1433 94 %     Weight 08/11/17 1435 140 lb (63.5 kg)     Height 08/11/17 1435 5\' 7"  (1.702 m)     Head Circumference --      Peak Flow --      Pain Score --      Pain Loc --      Pain Edu? --      Excl. in [place]? --      Constitutional: Alert and oriented.  Mildly ill appearing but in no acute distress.  Patient is extremely hard of hearing.  Appears very weak. Eyes: Conjunctivae are normal. PERRL. EOMI. Head: Atraumatic. ENT:      Ears:       Nose: No congestion/rhinnorhea.      Mouth/Throat: Mucous membranes are moist.  Neck: No stridor.   Hematological/Lymphatic/Immunilogical: No cervical lymphadenopathy. Cardiovascular: Normal rate, regular rhythm. Normal S1 and S2.  Good peripheral circulation. Respiratory: Normal respiratory effort without tachypnea or retractions. Lungs with significant crackles, rhonchi, coarse breath sounds throughout bilateral lobes..  Decreased breath sounds to bilateral bases. Gastrointestinal: Bowel sounds 4 quadrants. Soft and nontender to palpation. No guarding or rigidity. No palpable masses. No distention. No CVA tenderness. Musculoskeletal: Full range of motion to all extremities. No gross deformities appreciated. Neurologic:  Normal speech and language. No gross focal neurologic deficits are appreciated.  Skin:  Skin is warm, dry and intact. No rash noted. Psychiatric: Mood and affect are normal. Speech and behavior are normal. Patient exhibits appropriate insight and  judgement.   ____________________________________________   LABS (all labs ordered are listed, but only abnormal results are displayed)  Labs Reviewed  CBC WITH DIFFERENTIAL/PLATELET - Abnormal; Notable for the following components:      Result Value   WBC 12.6 (*)    RBC 3.75 (*)    Hemoglobin 12.1 (*)    HCT 35.5 (*)    Platelets 674 (*)    Neutro Abs 10.1 (*)    Monocytes Absolute 1.5 (*)    All other components within normal limits  COMPREHENSIVE METABOLIC PANEL - Abnormal; Notable for the following components:   Sodium 128 (*)    Chloride 92 (*)    Glucose, Bld 126 (*)    Calcium 8.2 (*)    Albumin 2.3 (*)    AST 113 (*)    All other components within normal limits  BLOOD GAS, VENOUS - Abnormal; Notable for the following components:  pH, Ven 7.45 (*)    pCO2, Ven 38 (*)    Acid-Base Excess 2.4 (*)    All other components within normal limits  CULTURE, BLOOD (ROUTINE X 2)  CULTURE, BLOOD (ROUTINE X 2)  BRAIN NATRIURETIC PEPTIDE  TROPONIN I  LACTIC ACID, PLASMA  URINALYSIS, COMPLETE (UACMP) WITH MICROSCOPIC   ____________________________________________  EKG  ED ECG REPORT I, [name] Cuthriell,  personally viewed and interpreted this ECG.   Date: 08/11/2017  EKG Time: 1632 hrs.  Rate: 70 bpm  Rhythm: there are no previous tracings available for comparison, normal sinus rhythm  Axis: Left axis deviation  Intervals:right bundle branch block  ST&T Change: No significant ST elevations or depressions noted.  Patient with normal sinus rhythm with incomplete right bundle branch block and left axis deviation.  No evidence of STEMI.  No previous EKG tracings for comparison.  Abnormal EKG.  ____________________________________________  RADIOLOGY Diamantina Providence Cuthriell, personally viewed and evaluated these images (plain radiographs) as part of my medical decision making, as well as reviewing the written report by the radiologist.  Dg Chest 2 View  Result  Date: 08/11/2017 CLINICAL DATA:  Cough for 6 years. EXAM: CHEST  2 VIEW COMPARISON:  03/10/2011 FINDINGS: Large loculated right lateral pleural effusion, possibly loculated anteriorly as well. Right lung airspace disease and left basilar opacities heart is normal size. No acute bony abnormality. IMPRESSION: Large loculated right pleural effusion. Right lung airspace disease concerning for pneumonia. Left basilar atelectasis or pneumonia. Electronically Signed   By: Rolm Baptise M.D.   On: 08/11/2017 15:09   Ct Chest W Contrast  Result Date: 08/11/2017 CLINICAL DATA:  Increased cough for 3 weeks. EXAM: CT CHEST WITH CONTRAST TECHNIQUE: Multidetector CT imaging of the chest was performed during intravenous contrast administration. CONTRAST:  20mL ISOVUE-300 IOPAMIDOL (ISOVUE-300) INJECTION 61% COMPARISON:  None. FINDINGS: Cardiovascular: Heart size normal. No pericardial effusion. Coronary artery calcification is evident. No thoracic aortic aneurysm. Mediastinum/Nodes: Scattered small to upper normal lymph nodes are seen in the mediastinum. Index subcarinal lymph node measures 10 mm short axis on image 83 series 2. There is no hilar lymphadenopathy. The esophagus has normal imaging features. There is no axillary lymphadenopathy. Lungs/Pleura: Moderate to large loculated right pleural fluid collections shows rim enhancement and contains debris and gas. There is adjacent compressive atelectasis with posterior right upper lobe pulmonary nodules measuring up to 7 mm. Marked bronchial wall thickening is noted in the lungs bilaterally with airway impaction in tree-in-bud nodularity in the left lower lobe transitioning into a areas of more confluent airspace opacity in the posterior left base. Upper Abdomen: 6 mm hypervascular lesion noted posterior right liver, too small to characterize. Otherwise unremarkable. Musculoskeletal: Bone windows reveal no worrisome lytic or sclerotic osseous lesions. IMPRESSION: 1. Small to  moderate loculated right pleural fluid collection containing debris and gas. Imaging features compatible with empyema. 2. Several small pulmonary nodules posterior right upper lobe. Attention on follow-up imaging recommended. 3. Bronchial wall thickening with small airway impaction, tree-in-bud nodularity and areas of confluent airspace disease in the left lower lobe. Imaging features suggest atypical infection. 4. Areas of collapse/consolidation in the right middle and lower lobes. Electronically Signed   By: Misty Stanley M.D.   On: 08/11/2017 18:09    ____________________________________________    PROCEDURES  Procedure(s) performed:    Procedures    Medications  sodium chloride 0.9 % bolus 1,000 mL (not administered)  piperacillin-tazobactam (ZOSYN) IVPB 4.5 g (not administered)  levofloxacin (LEVAQUIN) IVPB  750 mg (not administered)  iopamidol (ISOVUE-300) 61 % injection 75 mL (75 mLs Intravenous Contrast Given 08/11/17 1733)     ____________________________________________   INITIAL IMPRESSION / ASSESSMENT AND PLAN / ED COURSE  Pertinent labs & imaging results that were available during my care of the patient were reviewed by me and considered in my medical decision making (see chart for details).  Review of the [place] CSRS was performed in accordance of the [place] prior to dispensing any controlled drugs.     Patient's diagnosis is consistent with community acquired pneumonia with empyema and multiple pulmonary nodules.  Patient presented with a several year history of cough that is which is worsened over the last 3-8 weeks.  Patient denies any fevers or chills.  He has increased shortness of breath, increased coughing.  Patient has a 40-pack-year history.  Patient presented with significant pulmonary effusion and atelectasis on x-ray.  Patient had adventitious lung sounds throughout bilateral lungs.  Based off the patient's history of weight loss, chronic coughing, worsening,  weakness, low O2 sats, patient was further evaluated with CT scans and this returns with elevated white blood cell count, borderline alkalosis, significant edema with signs of atypical infection in the lungs.  At this time, patient will be started on Zosyn and Levaquin IV as well as hydration.  Discussed the patient's case with hospitalist who agrees that patient is a candidate for admission for IV antibiotics and empyema drainage.  Patient care will be turned over to hospitalist service for further treatment and evaluation..     ____________________________________________  FINAL CLINICAL IMPRESSION(S) / ED DIAGNOSES  Final diagnoses:  Atypical pneumonia  Empyema ([place])  Pulmonary nodules      NEW MEDICATIONS STARTED DURING THIS VISIT:  ED Discharge Orders    None          This chart was dictated using voice recognition software/Dragon. Despite best efforts to proofread, errors can occur which can change the meaning. Any change was purely unintentional.    Darletta Moll, PA-C 08/11/17 1911    Arta Silence, MD 08/12/17 [phone]

## 2017-08-11 NOTE — Progress Notes (Signed)
ANTIBIOTIC CONSULT NOTE - INITIAL  Pharmacy Consult for Zosyn and levofloxacin Indication: empyema  No Known Allergies  Patient Measurements: Height: 5\' 7"  (170.2 cm) Weight: 140 lb (63.5 kg) IBW/kg (Calculated) : 66.1 Adjusted Body Weight:   Vital Signs: Temp: 98.9 F (37.2 C) (12/01 1433) Temp Source: Oral (12/01 1433) BP: 125/67 (12/01 1849) Pulse Rate: 75 (12/01 1849) Intake/Output from previous day: No intake/output data recorded. Intake/Output from this shift: No intake/output data recorded.  Labs: Recent Labs    08/11/17 1624  WBC 12.6*  HGB 12.1*  PLT 674*  CREATININE 0.79   Estimated Creatinine Clearance: 84.9 mL/min (by C-G formula based on SCr of 0.79 mg/dL). No results for input(s): VANCOTROUGH, VANCOPEAK, VANCORANDOM, GENTTROUGH, GENTPEAK, GENTRANDOM, TOBRATROUGH, TOBRAPEAK, TOBRARND, AMIKACINPEAK, AMIKACINTROU, AMIKACIN in the last 72 hours.   Microbiology: No results found for this or any previous visit (from the past 720 hour(s)).  Medical History: Past Medical History:  Diagnosis Date  . Arthritis    bad back, pinched nerve  . Asymmetrical sensorineural hearing loss 2012   Right ear (odd appearance), s/p eval by ENT and MRI, never followed up.  will recommend pt f/u.  Marland Kitchen Depression   . History of chicken pox   . Hx: UTI (urinary tract infection)   . Smoker     Medications:  Infusions:  . levofloxacin (LEVAQUIN) IV    . [START ON 08/12/2017] levofloxacin (LEVAQUIN) IV    . small volume/piggyback builder    . [START ON 08/12/2017] piperacillin-tazobactam (ZOSYN)  IV     Assessment: 61 yom cc cough worsening over several years. Lost approximately 45 lbs over past year. No fever/chills/body aches, but has increasing weakness. CT chest shows small to moderate loculated right pleural fluid collection consistent with empyema, several small pulmonary nodules RUL posteriorly, areas of collapse in RML and RLL. Imaging features suggest atypical  infection. Pharmacy consulted to dose Zosyn and Levaquin for empyema  Goal of Therapy:  Resolve infection Prevent ADE  Plan:  1. Zosyn 4.5 gm IV x 1 in ED followed by Zosyn 3.375 gm IV Q8H EI 2. Levofloxacin 750 mg IV Q24H  Laural Benes, Pharm.D., BCPS Clinical Pharmacist 08/11/2017,7:47 PM

## 2017-08-11 NOTE — ED Notes (Signed)
See triage note  Presents with family for cough  Per pt he has had a cough for about 6-7 years  But thinks it has become worse over the past 3 weeks.  unsure of fever/chills

## 2017-08-12 LAB — BASIC METABOLIC PANEL
Anion gap: 9 (ref 5–15)
BUN: 9 mg/dL (ref 6–20)
CALCIUM: 7.8 mg/dL — AB (ref 8.9–10.3)
CHLORIDE: 99 mmol/L — AB (ref 101–111)
CO2: 22 mmol/L (ref 22–32)
CREATININE: 0.66 mg/dL (ref 0.61–1.24)
GFR calc Af Amer: >60 mL/min (ref >60)
GFR calc non Af Amer: >60 mL/min (ref >60)
GLUCOSE: 112 mg/dL — AB (ref 65–99)
Potassium: 4.1 mmol/L (ref 3.5–5.1)
Sodium: 130 mmol/L — ABNORMAL LOW (ref 135–145)

## 2017-08-12 LAB — CBC
HCT: 32.5 % — ABNORMAL LOW (ref 40.0–52.0)
HEMOGLOBIN: 11.3 g/dL — AB (ref 13.0–18.0)
MCH: 33.3 pg (ref 26.0–34.0)
MCHC: 34.9 g/dL (ref 32.0–36.0)
MCV: 95.3 fL (ref 80.0–100.0)
Platelets: 665 10*3/uL — ABNORMAL HIGH (ref 150–440)
RBC: 3.41 MIL/uL — ABNORMAL LOW (ref 4.40–5.90)
RDW: 12.8 % (ref 11.5–14.5)
WBC: 9.8 10*3/uL (ref 3.8–10.6)

## 2017-08-12 MED ORDER — THIAMINE HCL 100 MG/ML IJ SOLN
100.0000 mg | Freq: Every day | INTRAMUSCULAR | Status: DC
  Filled 2017-08-12: qty 2

## 2017-08-12 MED ORDER — LORAZEPAM 2 MG/ML IJ SOLN
0.0000 mg | Freq: Four times a day (QID) | INTRAMUSCULAR | Status: DC
  Administered 2017-08-13 (×2): 2 mg via INTRAVENOUS
  Filled 2017-08-12 (×2): qty 1

## 2017-08-12 MED ORDER — VITAMIN B-1 100 MG PO TABS
100.0000 mg | ORAL_TABLET | Freq: Every day | ORAL | Status: DC
  Administered 2017-08-12 – 2017-08-21 (×9): 100 mg via ORAL
  Filled 2017-08-12 (×9): qty 1

## 2017-08-12 MED ORDER — FOLIC ACID 1 MG PO TABS
1.0000 mg | ORAL_TABLET | Freq: Every day | ORAL | Status: DC
  Administered 2017-08-12 – 2017-08-21 (×9): 1 mg via ORAL
  Filled 2017-08-12 (×9): qty 1

## 2017-08-12 MED ORDER — LORAZEPAM 1 MG PO TABS
1.0000 mg | ORAL_TABLET | Freq: Four times a day (QID) | ORAL | Status: AC | PRN
  Administered 2017-08-13: 1 mg via ORAL
  Filled 2017-08-12: qty 1

## 2017-08-12 MED ORDER — ADULT MULTIVITAMIN W/MINERALS CH
1.0000 | ORAL_TABLET | Freq: Every day | ORAL | Status: DC
  Administered 2017-08-12 – 2017-08-21 (×9): 1 via ORAL
  Filled 2017-08-12 (×9): qty 1

## 2017-08-12 MED ORDER — LORAZEPAM 2 MG/ML IJ SOLN: 1.0000 mg | Freq: Four times a day (QID) | INTRAMUSCULAR | Status: AC | PRN

## 2017-08-12 MED ORDER — LORAZEPAM 2 MG/ML IJ SOLN: 0.0000 mg | Freq: Two times a day (BID) | INTRAMUSCULAR | Status: DC

## 2017-08-12 NOTE — Progress Notes (Addendum)
[place] [facility] [place] NAME: Erinn Huskins    MR#:  [id number]  DATE OF BIRTH:  02/23/1956  SUBJECTIVE:  CHIEF COMPLAINT:   Chief Complaint  Patient presents with  . Cough   has cough and shortness of breath. REVIEW OF SYSTEMS:  Review of Systems  Constitutional: Negative for chills, fever and malaise/fatigue.  HENT: Negative for sore throat.   Eyes: Negative for blurred vision and double vision.  Respiratory: Positive for cough and shortness of breath. Negative for hemoptysis, wheezing and stridor.   Cardiovascular: Negative for chest pain, palpitations, orthopnea and leg swelling.  Gastrointestinal: Negative for abdominal pain, blood in stool, diarrhea, melena, nausea and vomiting.  Genitourinary: Negative for dysuria, flank pain and hematuria.  Musculoskeletal: Negative for back pain and joint pain.  Neurological: Negative for dizziness, sensory change, focal weakness, seizures, loss of consciousness, weakness and headaches.  Endo/Heme/Allergies: Negative for polydipsia.  Psychiatric/Behavioral: Negative for depression. The patient is not nervous/anxious.     DRUG ALLERGIES:  No Known Allergies VITALS:  Blood pressure (!) 100/58, pulse 66, temperature 98.3 F (36.8 C), temperature source Oral, resp. rate 18, height 5\' 7"  (1.702 m), weight 122 lb 12.8 oz (55.7 kg), SpO2 98 %. PHYSICAL EXAMINATION:  Physical Exam  Constitutional: He is oriented to person, place, and time and well-developed, well-nourished, and in no distress.  HENT:  Head: Normocephalic.  Mouth/Throat: Oropharynx is clear and moist.  Eyes: Conjunctivae and EOM are normal. Pupils are equal, round, and reactive to light. No scleral icterus.  Neck: Normal range of motion. Neck supple. No JVD present. No tracheal deviation present.  Cardiovascular: Normal rate, regular rhythm and normal heart sounds. Exam reveals no gallop.  No murmur heard. Pulmonary/Chest: Effort  normal and breath sounds normal. No respiratory distress. He has no wheezes. He has no rales.  Abdominal: Soft. Bowel sounds are normal. He exhibits no distension. There is no tenderness. There is no rebound.  Musculoskeletal: Normal range of motion. He exhibits no edema or tenderness.  Neurological: He is alert and oriented to person, place, and time. No cranial nerve deficit.  Skin: No rash noted. No erythema.  Psychiatric: Affect normal.   LABORATORY PANEL:  Male CBC Recent Labs  Lab 08/12/17 0505  WBC 9.8  HGB 11.3*  HCT 32.5*  PLT 665*   ------------------------------------------------------------------------------------------------------------------ Chemistries  Recent Labs  Lab 08/11/17 1624 08/12/17 0505  NA 128* 130*  K 3.6 4.1  CL 92* 99*  CO2 24 22  GLUCOSE 126* 112*  BUN 13 9  CREATININE 0.79 0.66  CALCIUM 8.2* 7.8*  AST 113*  --   ALT 53  --   ALKPHOS 110  --   BILITOT 0.8  --    RADIOLOGY:  Dg Chest 2 View  Result Date: 08/11/2017 CLINICAL DATA:  Cough for 6 years. EXAM: CHEST  2 VIEW COMPARISON:  03/10/2011 FINDINGS: Large loculated right lateral pleural effusion, possibly loculated anteriorly as well. Right lung airspace disease and left basilar opacities heart is normal size. No acute bony abnormality. IMPRESSION: Large loculated right pleural effusion. Right lung airspace disease concerning for pneumonia. Left basilar atelectasis or pneumonia. Electronically Signed   By: Rolm Baptise M.D.   On: 08/11/2017 15:09   Ct Chest W Contrast  Result Date: 08/11/2017 CLINICAL DATA:  Increased cough for 3 weeks. EXAM: CT CHEST WITH CONTRAST TECHNIQUE: Multidetector CT imaging of the chest was performed during intravenous contrast administration. CONTRAST:  34mL ISOVUE-300 IOPAMIDOL (  ISOVUE-300) INJECTION 61% COMPARISON:  None. FINDINGS: Cardiovascular: Heart size normal. No pericardial effusion. Coronary artery calcification is evident. No thoracic aortic aneurysm.  Mediastinum/Nodes: Scattered small to upper normal lymph nodes are seen in the mediastinum. Index subcarinal lymph node measures 10 mm short axis on image 83 series 2. There is no hilar lymphadenopathy. The esophagus has normal imaging features. There is no axillary lymphadenopathy. Lungs/Pleura: Moderate to large loculated right pleural fluid collections shows rim enhancement and contains debris and gas. There is adjacent compressive atelectasis with posterior right upper lobe pulmonary nodules measuring up to 7 mm. Marked bronchial wall thickening is noted in the lungs bilaterally with airway impaction in tree-in-bud nodularity in the left lower lobe transitioning into a areas of more confluent airspace opacity in the posterior left base. Upper Abdomen: 6 mm hypervascular lesion noted posterior right liver, too small to characterize. Otherwise unremarkable. Musculoskeletal: Bone windows reveal no worrisome lytic or sclerotic osseous lesions. IMPRESSION: 1. Small to moderate loculated right pleural fluid collection containing debris and gas. Imaging features compatible with empyema. 2. Several small pulmonary nodules posterior right upper lobe. Attention on follow-up imaging recommended. 3. Bronchial wall thickening with small airway impaction, tree-in-bud nodularity and areas of confluent airspace disease in the left lower lobe. Imaging features suggest atypical infection. 4. Areas of collapse/consolidation in the right middle and lower lobes. Electronically Signed   By: Misty Stanley M.D.   On: 08/11/2017 18:09   ASSESSMENT AND PLAN:   Pt is 61 year old white male with history of smoking presenting with cough and weight loss  1. Empyema as well as other underlying lung infection Continue patient on Zosyn and Levaquin Consult pulmonary Also consult CT surgery Obtain sputum cultures if possible. Robitussin prn.  2.  Hyponatremia possibly due to SIADH and or due to dehydration  Continue normal  saline If no improvement then look for SIADH as a cause  3.  Nicotine abuse smoking cessation provided, on nicotine patch.  Alcohol abuse.  On Cipro protocol  All the records are reviewed and case discussed with Care Management/Social Worker. Management plans discussed with the patient, his sisters and they are in agreement.  CODE STATUS: Full Code  TOTAL TIME TAKING CARE OF THIS PATIENT: 43 minutes.   More than 50% of the time was spent in counseling/coordination of care: YES  POSSIBLE D/C IN 3 DAYS, DEPENDING ON CLINICAL CONDITION.   Demetrios Loll M.D on 08/12/2017 at 1:14 PM  Between 7am to 6pm - Pager - [phone]  After 6pm go to [email or web address] - [facility]

## 2017-08-12 NOTE — Care Management Note (Addendum)
Case Management Note  Patient Details  Name: Adam Boyd MRN: [id number] Date of Birth: [birth year]  Subjective/Objective:   Uninsured 61yo Adam Adam Boyd was admitted with shortness of breathe and coughing, and was diagnosed with Empyema. His PCP is Dr [name]. Pharmacy=[facility] on [address]. He reports no home assistive equipment, no home 02, no home health services. Family and friends provide transportation. After this hospitalization Adam Boyd and his sister Adam Boyd report that he will be residing with Adam Boyd for an undetermined amount of time. Adam Boyd, [facility]: [phone], [address], [place], [address]. Discussed home health providers with family and that because Adam Soltys is uninsured, that any home health services would be provided by [facility].  Case management will follow for discharge planning.     In-House Referral:     Discharge planning Services     Post Acute Care Choice:    Choice offered to:     DME Arranged:    DME Agency:     HH Arranged:    HH Agency:     Status of Service:     If discussed at [employer] of Stay Meetings, dates discussed:    Additional Comments:  [name] A, RN 08/12/2017, 3:06 PM

## 2017-08-12 NOTE — Progress Notes (Signed)
Prime doc paged pt confused, pulled out IV, son in room, states pt drinks 3-8 beers a night and has been confused at home.

## 2017-08-13 DIAGNOSIS — J869 Pyothorax without fistula: Principal | ICD-10-CM

## 2017-08-13 MED ORDER — ENSURE ENLIVE PO LIQD
237.0000 mL | Freq: Three times a day (TID) | ORAL | Status: DC
  Administered 2017-08-13 – 2017-08-21 (×21): 237 mL via ORAL

## 2017-08-13 NOTE — Progress Notes (Addendum)
   [place] [facility] [place] NAME: Aariz Maish    MR#:  [id number]  DATE OF BIRTH:  [birth year]  SUBJECTIVE:  CHIEF COMPLAINT:   Chief Complaint  Patient presents with  . Cough   The patient was agitated last night due to withdrawal, given Ativan.  He is sleeping without responsive. REVIEW OF SYSTEMS:  Review of Systems  Unable to perform ROS: Medical condition    DRUG ALLERGIES:  No Known Allergies VITALS:  Blood pressure (!) 105/59, pulse 81, temperature 97.7 F (36.5 C), temperature source Axillary, resp. rate (!) 28, height 5\' 7"  (1.702 m), weight 122 lb 12.8 oz (55.7 kg), SpO2 100 %. PHYSICAL EXAMINATION:  Physical Exam  Constitutional: He is oriented to person, place, and time and well-developed, well-nourished, and in no distress.  HENT:  Head: Normocephalic.  Mouth/Throat: Oropharynx is clear and moist.  Eyes: Conjunctivae and EOM are normal. Pupils are equal, round, and reactive to light. No scleral icterus.  Neck: Normal range of motion. Neck supple. No JVD present. No tracheal deviation present.  Cardiovascular: Normal rate, regular rhythm and normal heart sounds. Exam reveals no gallop.  No murmur heard. Pulmonary/Chest: Effort normal and breath sounds normal. No respiratory distress. He has no wheezes. He has no rales.  Abdominal: Soft. Bowel sounds are normal. He exhibits no distension. There is no tenderness. There is no rebound.  Musculoskeletal: Normal range of motion. He exhibits no edema or tenderness.  Neurological: He is alert and oriented to person, place, and time. No cranial nerve deficit.  Skin: No rash noted. No erythema.  Psychiatric: Affect normal.   LABORATORY PANEL:  Male CBC Recent Labs  Lab 08/12/17 0505  WBC 9.8  HGB 11.3*  HCT 32.5*  PLT 665*   ------------------------------------------------------------------------------------------------------------------ Chemistries  Recent Labs  Lab  08/11/17 1624 08/12/17 0505  NA 128* 130*  K 3.6 4.1  CL 92* 99*  CO2 24 22  GLUCOSE 126* 112*  BUN 13 9  CREATININE 0.79 0.66  CALCIUM 8.2* 7.8*  AST 113*  --   ALT 53  --   ALKPHOS 110  --   BILITOT 0.8  --    RADIOLOGY:  No results found. ASSESSMENT AND PLAN:   Pt is 61 year old white male with history of smoking presenting with cough and weight loss  1. Right-sided empyema. Continue Zosyn and Levaquin. ID consult. Dr. Genevive Bi  recommend a CT-guided large bore chest drain to be placed.   2.  Hyponatremia possibly due to SIADH and or due to dehydration  Continue normal saline  3.  Nicotine abuse smoking cessation provided, on nicotine patch.  Alcohol abuse and withdrawal.  On CIWA protocol.  All the records are reviewed and case discussed with Care Management/Social Worker. Management plans discussed with the patient, his sisters and they are in agreement.  CODE STATUS: Full Code  TOTAL TIME TAKING CARE OF THIS PATIENT: 28 minutes.   More than 50% of the time was spent in counseling/coordination of care: YES  POSSIBLE D/C IN 2 DAYS, DEPENDING ON CLINICAL CONDITION.   Demetrios Loll M.D on 08/13/2017 at 3:14 PM  Between 7am to 6pm - Pager - [phone]  After 6pm go to [email or web address] - [facility]

## 2017-08-13 NOTE — Progress Notes (Signed)
Patient ID: KENLY XIAO, male   DOB: 18-Jul-1956, 61 y.o.   MRN: [id number]  Chief Complaint  Patient presents with  . Cough    Referred By Dr. Bridgett Larsson  Reason for Referral right-sided empyema  HPI Location, Quality, Duration, Severity, Timing, Context, Modifying Factors, Associated Signs and Symptoms.  Adam Boyd is a 61 y.o. male.  The patient was seen and examined.  The history is obtained from the chart and from the sister.  Patient was verbalizing but was confused and I had great difficulty in understanding the phrases.  His sister states that he has been confused for the last 24 hours.  Therefore the history could not be obtained from the patient.  The sister states that he has had an increasing cough for the last 3-4 weeks.  In addition he has had some weight loss and overall functional deterioration.  Ultimately he was brought to the emergency department where chest x-ray and CT scan were performed.  This revealed what appears to be a complex right pleural effusion most consistent with empyema.  The patient was admitted to the hospital where he was placed on broad-spectrum antibiotics.  No thoracentesis or chest tube has been inserted yet.  He does have a long-standing history of tobacco use as well as alcohol use.  His sister states he smokes at least a pack cigarettes a day and that he drinks at least 3 beers per night.  She states that there is been no prior cardiac or pulmonary history.  She does not know of any drug allergies and she states that he takes no medications.  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  Since that time he has been unemployed and uninsured.   Past Medical History:  Diagnosis Date  . Arthritis    bad back, pinched nerve  . Asymmetrical sensorineural hearing loss 2012   Right ear (odd appearance), s/p eval by ENT and MRI, never followed up.  will recommend pt f/u.  Marland Kitchen Depression   . History  of chicken pox   . Hx: UTI (urinary tract infection)   . Smoker     Past Surgical History:  Procedure Laterality Date  . CATARACT EXTRACTION     bilateral, 5 years ago  . 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  scattere small foci of hyperintensity (sequela or chronic small vessel ischemia vs demyelinating disorder, mild fluid R mastoid air cells, paranasal sinus disease    Family History  Problem Relation Age of Onset  . Hypertension Mother   . Diabetes Mother   . Stroke Paternal Grandmother   . Coronary artery disease Neg Hx   . Cancer Neg Hx     Social History Social History   Tobacco Use  . Smoking status: Current Every Day Smoker    Packs/day: 1.00    Years: 42.00    Pack years: 42.00    Types: Cigarettes  . Smokeless tobacco: Never Used  Substance Use Topics  . Alcohol use: Yes    Comment: 1-2 beers/day  . Drug use: No    No Known Allergies  Current Facility-Administered Medications  Medication Dose Route Frequency Provider Last Rate Last Dose  . 0.9 %  sodium chloride infusion   Intravenous Continuous Dustin Flock, MD 75 mL/hr at 08/13/17 0156    .  acetaminophen (TYLENOL) tablet 650 mg  650 mg Oral Q6H PRN Dustin Flock, MD   650 mg at 08/12/17 1246   Or  . acetaminophen (TYLENOL) suppository 650 mg  650 mg Rectal Q6H PRN Dustin Flock, MD      . feeding supplement (ENSURE ENLIVE) (ENSURE ENLIVE) liquid 237 mL  237 mL Oral TID BM Demetrios Loll, MD   237 mL at 08/13/17 1400  . folic acid (FOLVITE) tablet 1 mg  1 mg Oral Daily Lance Coon, MD   1 mg at 08/13/17 1118  . guaiFENesin (MUCINEX) 12 hr tablet 600 mg  600 mg Oral BID Dustin Flock, MD   600 mg at 08/13/17 1118  . heparin injection 5,000 Units  5,000 Units Subcutaneous Q8H Dustin Flock, MD   5,000 Units at 08/13/17 1358  . Influenza vac split quadrivalent PF (FLUARIX) injection 0.5 mL  0.5 mL Intramuscular  Tomorrow-1000 Dustin Flock, MD      . levofloxacin (LEVAQUIN) IVPB 750 mg  750 mg Intravenous Q24H Dustin Flock, MD   Stopped at 08/12/17 2220  . LORazepam (ATIVAN) injection 0-4 mg  0-4 mg Intravenous Q6H Lance Coon, MD   2 mg at 08/13/17 0545   Followed by  . [START ON 08/14/2017] LORazepam (ATIVAN) injection 0-4 mg  0-4 mg Intravenous Laurence Spates, MD      . LORazepam (ATIVAN) tablet 1 mg  1 mg Oral Q6H PRN Lance Coon, MD   1 mg at 08/13/17 0156   Or  . LORazepam (ATIVAN) injection 1 mg  1 mg Intravenous Q6H PRN Lance Coon, MD      . multivitamin with minerals tablet 1 tablet  1 tablet Oral Daily Lance Coon, MD   1 tablet at 08/13/17 1118  . nicotine (NICODERM CQ - dosed in mg/24 hours) patch 21 mg  21 mg Transdermal Daily Dustin Flock, MD   21 mg at 08/13/17 1121  . ondansetron (ZOFRAN) tablet 4 mg  4 mg Oral Q6H PRN Dustin Flock, MD       Or  . ondansetron (ZOFRAN) injection 4 mg  4 mg Intravenous Q6H PRN Dustin Flock, MD      . piperacillin-tazobactam (ZOSYN) IVPB 3.375 g  3.375 g Intravenous Q8H Dustin Flock, MD 12.5 mL/hr at 08/13/17 1358 3.375 g at 08/13/17 1358  . thiamine (VITAMIN B-1) tablet 100 mg  100 mg Oral Daily Lance Coon, MD   100 mg at 08/13/17 1119   Or  . thiamine (B-1) injection 100 mg  100 mg Intravenous Daily Lance Coon, MD          Review of Systems A complete review of systems was asked and was negative except for the following positive findings unable to be obtained from the patient but the sister states that for the last 3 weeks he is steadily lost weight and has had a cough but no hemoptysis to her knowledge.  She states she has had occasional night sweats but no fevers.  She states she is probably lost some weight as well.  Blood pressure (!) 105/59, pulse 81, temperature 97.7 F (36.5 C), temperature source Axillary, resp. rate (!) 28, height 5\' 7"  (1.702 m), weight 122 lb 12.8 oz (55.7 kg), SpO2 100 %.  Physical  Exam CONSTITUTIONAL:  Pleasant, well-developed, very thin, and in no acute distress. EYES: Pupils equal and reactive to light, Sclera non-icteric EARS, NOSE, MOUTH AND THROAT:  The oropharynx was clear.  Dentition is poor repair.  Oral mucosa pink and  moist. LYMPH NODES:  Lymph nodes in the neck and axillae were normal RESPIRATORY:  Lungs were clear on the left and diminished throughout the right side..  Normal respiratory effort without pathologic use of accessory muscles of respiration CARDIOVASCULAR: Heart was regular without murmurs.  There were no carotid bruits. GI: The abdomen was soft, nontender, and nondistended. There were no palpable masses. There was no hepatosplenomegaly. There were normal bowel sounds in all quadrants. GU:  Rectal deferred.   MUSCULOSKELETAL:  Normal muscle strength and tone.  No clubbing or cyanosis.   SKIN:  There were no pathologic skin lesions.  There were no nodules on palpation. NEUROLOGIC:  Sensation is normal.  Cranial nerves are grossly intact. PSYCH: Confused.  His speech was difficult to understand.  He did not appear to be in any distress whatsoever though.  Data Reviewed Chest x-ray and CT  I have personally reviewed the patient's imaging, laboratory findings and medical records.    Assessment    I have independently reviewed the patient's chest x-ray and CT scans.  I believe he has a right-sided empyema.    Plan    I would recommend a percutaneous approach.  I would recommend a CT-guided large bore chest drain to be placed.  This should be placed to 20 cm water suction.  If the lung does not expand he should then have intrapleural thrombolytics.  I explained to the sister in detail that if that did not work then he would be looking at a thoracotomy for decortication.  I explained to her the indications for that surgery as well as the postoperative management.  I also discussed his care today with our social worker because he will likely require  assistance upon discharge and he will have chest tubes in place.    I will continue to follow the patient with you.   Nestor Lewandowsky, MD 08/13/2017, 3:09 PM
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
## 2017-08-13 NOTE — Progress Notes (Signed)
Initial Nutrition Assessment  DOCUMENTATION CODES:   Underweight  INTERVENTION:  Recommend check Mg and P as pt at refeeding risk  Ensure Enlive po TID, each supplement provides 350 kcal and 20 grams of protein  Magic cup TID with meals, each supplement provides 290 kcal and 9 grams of protein  MVI daily  NUTRITION DIAGNOSIS:   Increased nutrient needs related to (etoh abuse and empyema) as evidenced by increased estimated needs from protein.  GOAL:   Patient will meet greater than or equal to 90% of their needs  MONITOR:   PO intake, Supplement acceptance, Labs, Weight trends  REASON FOR ASSESSMENT:   Malnutrition Screening Tool    ASSESSMENT:   61 year old white male with history of etoh abuse and smoking presenting with cough and weight loss. Pt found to have Empyema as well as other underlying lung infection   Unable to see pt today x 2 visits. Spoke with pt's sister who reports that pt does not eat a lot at baseline and he does not drink supplements at home. She reports that pt's appetite and oral intake has decreased recently and that she feels he has lost weight. Per chart, pt reports a  45lb(27%) wt loss over the past year; this is significant. There is no recent weight history in the chart for this pt. Pt with h/o etoh abuse; CIWA protocol started. RD will order supplements. RD suspects this pt with malnutrition but unable to diagnose today r/t unable to perform nutrition focused exam and no documented wt history. RD will obtain nutrition related history and exam at follow up. Pt likely at high refeeding risk; recommend check Mg and P labs.   Medications reviewed and include: folic acid, heparin, MVI, nicotine, thiamine, NaCl @75ml /hr, zosyn  Labs reviewed: Na 130(L), Cl 99(L), Ca 7.8(L)- 12/2 Hgb 11.3(L), Hct 32.5(L)  Unable to complete Nutrition-Focused physical exam at this time.   Diet Order:  Diet regular Room service appropriate? Yes; Fluid consistency:  Thin  EDUCATION NEEDS:   Not appropriate for education at this time  Skin:  Reviewed RN Assessment  Last BM:  11/29  Height:   Ht Readings from Last 1 Encounters:  08/11/17 5\' 7"  (1.702 m)    Weight:   Wt Readings from Last 1 Encounters:  08/11/17 122 lb 12.8 oz (55.7 kg)    Ideal Body Weight:  67.27 kg  BMI:  Body mass index is 19.23 kg/m.  Estimated Nutritional Needs:   Kcal:  1700-2000kcal/day   Protein:  83-94g/day   Fluid:  >1.7L/day   Koleen Distance MS, RD, LDN Pager #[phone] After Hours Pager: [phone]

## 2017-08-14 LAB — BASIC METABOLIC PANEL
Anion gap: 10 (ref 5–15)
BUN: 6 mg/dL (ref 6–20)
CALCIUM: 8 mg/dL — AB (ref 8.9–10.3)
CHLORIDE: 102 mmol/L (ref 101–111)
CO2: 19 mmol/L — AB (ref 22–32)
CREATININE: 0.74 mg/dL (ref 0.61–1.24)
GFR calc non Af Amer: >60 mL/min (ref >60)
Glucose, Bld: 102 mg/dL — ABNORMAL HIGH (ref 65–99)
Potassium: 3.7 mmol/L (ref 3.5–5.1)
Sodium: 131 mmol/L — ABNORMAL LOW (ref 135–145)

## 2017-08-14 LAB — PHOSPHORUS: Phosphorus: 2.8 mg/dL (ref 2.5–4.6)

## 2017-08-14 LAB — HIV ANTIBODY (ROUTINE TESTING W REFLEX): HIV SCREEN 4TH GENERATION: NONREACTIVE

## 2017-08-14 LAB — MAGNESIUM: MAGNESIUM: 1.7 mg/dL (ref 1.7–2.4)

## 2017-08-14 NOTE — Progress Notes (Signed)
  Patient ID: Adam Boyd, male   DOB: [birth year], 61 y.o.   MRN: [id number]  HISTORY: He remains confused this morning.  His sister is at his bedside.  She states that he was restless last evening and received some sedation.  Today he is somnolent but arousable.  His speech is unintelligible.   Vitals:   08/14/17 0514 08/14/17 1219  BP: (!) 111/58 (!) 101/58  Pulse: 79 86  Resp:  20  Temp: 97.6 F (36.4 C) 98.1 F (36.7 C)  SpO2: 97% 98%     EXAM:    Resp: Lungs are clear on the left but diminished on the right.  No respiratory distress, normal effort. Heart:  Regular without murmurs Abd:  Abdomen is soft, non distended and non tender. No masses are palpable.  There is no rebound and no guarding.   Skin: Skin is warm and dry. No rash noted. No diaphoretic. No erythema. No pallor.  Psychiatric: Difficult to assess secondary to somnolence and unintelligible speech   ASSESSMENT: I still believe the patient has a right-sided empyema and I would recommend a percutaneous drain to be placed.  Once this is in place we can then make a better assessment as to how to manage this.   PLAN:   I will await the results of the percutaneous drain before any further recommendations are made.  Intrapleural thrombolytics would certainly be an ideal situation for him.  However at present time we are still waiting for the percutaneous drain placement.  Once that is complete I can then make further recommendations.    Nestor Lewandowsky, MD

## 2017-08-14 NOTE — Consult Note (Signed)
[facility] Infectious Disease     Reason for [employer]    Referring Physician: Dr Bridgett Larsson Date of Admission:  08/11/2017   Active Problems:   Empyema [facility])   HPI: Adam Boyd is a 61 y.o. male admitted with cough x 3-4 weeks and wt loss. He had wbc 12, no fever, cxr and CT with R empyema, multiple small nodules, tree in bud appearance. Seen by Dr Genevive Bi and rec perc drainage. He has hx tobacco and etoh abuse.    Past Medical History:  Diagnosis Date  . Arthritis    bad back, pinched nerve  . Asymmetrical sensorineural hearing loss 2012   Right ear (odd appearance), s/p eval by ENT and MRI, never followed up.  will recommend pt f/u.  Marland Kitchen Depression   . History of chicken pox   . Hx: UTI (urinary tract infection)   . Smoker    Past Surgical History:  Procedure Laterality Date  . CATARACT EXTRACTION     bilateral, 5 years ago  . 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  scattere small foci of hyperintensity (sequela or chronic small vessel ischemia vs demyelinating disorder, mild fluid R mastoid air cells, paranasal sinus disease   Social History   Tobacco Use  . Smoking status: Current Every Day Smoker    Packs/day: 1.00    Years: 42.00    Pack years: 42.00    Types: Cigarettes  . Smokeless tobacco: Never Used  Substance Use Topics  . Alcohol use: Yes    Comment: 1-2 beers/day  . Drug use: No   Family History  Problem Relation Age of Onset  . Hypertension Mother   . Diabetes Mother   . Stroke Paternal Grandmother   . Coronary artery disease Neg Hx   . Cancer Neg Hx     Allergies: No Known Allergies  Current antibiotics: Antibiotics Given (last 72 hours)    Date/Time Action Medication Dose Rate   08/11/17 2230 New Bag/Given  [not given in ed]   piperacillin-tazobactam (ZOSYN) 4.5 g in sodium chloride 0.9 % 100 mL injection  200 mL/hr   08/12/17 0025 New Bag/Given  [Dosr not  given in ed]   levofloxacin (LEVAQUIN) IVPB 750 mg 750 mg 100 mL/hr   08/12/17 0636 New Bag/Given   piperacillin-tazobactam (ZOSYN) IVPB 3.375 g 3.375 g 12.5 mL/hr   08/12/17 1423 New Bag/Given   piperacillin-tazobactam (ZOSYN) IVPB 3.375 g 3.375 g 12.5 mL/hr   08/12/17 2048 New Bag/Given   levofloxacin (LEVAQUIN) IVPB 750 mg 750 mg 100 mL/hr   08/12/17 2300 New Bag/Given   piperacillin-tazobactam (ZOSYN) IVPB 3.375 g 3.375 g 12.5 mL/hr   08/13/17 0554 New Bag/Given   piperacillin-tazobactam (ZOSYN) IVPB 3.375 g 3.375 g 12.5 mL/hr   08/13/17 1358 New Bag/Given   piperacillin-tazobactam (ZOSYN) IVPB 3.375 g 3.375 g 12.5 mL/hr   08/13/17 2011 New Bag/Given   levofloxacin (LEVAQUIN) IVPB 750 mg 750 mg 100 mL/hr   08/13/17 2236 New Bag/Given   piperacillin-tazobactam (ZOSYN) IVPB 3.375 g 3.375 g 12.5 mL/hr   08/14/17 0604 New Bag/Given   piperacillin-tazobactam (ZOSYN) IVPB 3.375 g 3.375 g 12.5 mL/hr   08/14/17 1335 New Bag/Given   piperacillin-tazobactam (ZOSYN) IVPB 3.375 g 3.375 g 12.5 mL/hr      MEDICATIONS: . feeding supplement (ENSURE ENLIVE)  237 mL Oral TID BM  . folic acid  1  mg Oral Daily  . guaiFENesin  600 mg Oral BID  . heparin  5,000 Units Subcutaneous Q8H  . multivitamin with minerals  1 tablet Oral Daily  . nicotine  21 mg Transdermal Daily  . thiamine  100 mg Oral Daily   Or  . thiamine  100 mg Intravenous Daily    Review of Systems - unable to obtain  OBJECTIVE: Temp:  [97.6 F (36.4 C)-98.1 F (36.7 C)] 98.1 F (36.7 C) (12/04 1219) Pulse Rate:  [77-86] 86 (12/04 1219) Resp:  [20] 20 (12/04 1219) BP: (101-116)/(58-65) 101/58 (12/04 1219) SpO2:  [97 %-98 %] 98 % (12/04 1219) Physical Exam  Constitutional:thin confused HENT: anicteric Mouth/Throat: Oropharynx is clear and moist. No oropharyngeal exudate.  Cardiovascular: Normal rate, regular rhythm and normal heart sounds. Exam reveals no gallop and no friction rub.  No murmur heard.   Pulmonary/Chest: Decreased BS on R.  Abdominal: Soft. Bowel sounds are normal. He exhibits no distension. There is no tenderness.  Lymphadenopathy:  He has no cervical adenopathy.  Neurological: He is alert and oriented to person, place, and time.  Skin: Skin is warm and dry. No rash noted. No erythema.  Psychiatric: He has a normal mood and affect. His behavior is normal.     LABS: Results for orders placed or performed during the hospital encounter of 08/11/17 (from the past 48 hour(s))  Magnesium     Status: None   Collection Time: 08/14/17  4:56 AM  Result Value Ref Range   Magnesium 1.7 1.7 - 2.4 mg/dL  Phosphorus     Status: None   Collection Time: 08/14/17  4:56 AM  Result Value Ref Range   Phosphorus 2.8 2.5 - 4.6 mg/dL  Basic metabolic panel     Status: Abnormal   Collection Time: 08/14/17  4:56 AM  Result Value Ref Range   Sodium 131 (L) 135 - 145 mmol/L   Potassium 3.7 3.5 - 5.1 mmol/L   Chloride 102 101 - 111 mmol/L   CO2 19 (L) 22 - 32 mmol/L   Glucose, Bld 102 (H) 65 - 99 mg/dL   BUN 6 6 - 20 mg/dL   Creatinine, Ser 0.74 0.61 - 1.24 mg/dL   Calcium 8.0 (L) 8.9 - 10.3 mg/dL   GFR calc non Af Amer >60 >60 mL/min   GFR calc Af Amer >60 >60 mL/min    Comment: (NOTE) The eGFR has been calculated using the CKD EPI equation. This calculation has not been validated in all clinical situations. eGFR's persistently <60 mL/min signify possible Chronic Kidney Disease.    Anion gap 10 5 - 15   No components found for: ESR, C REACTIVE PROTEIN MICRO: Recent Results (from the past 720 hour(s))  Culture, blood (routine x 2)     Status: None (Preliminary result)   Collection Time: 08/11/17  7:09 PM  Result Value Ref Range Status   Specimen Description BLOOD RIGHT FOREARM  Final   Special Requests   Final    BOTTLES DRAWN AEROBIC AND ANAEROBIC Blood Culture adequate volume   Culture NO GROWTH 3 DAYS  Final   Report Status PENDING  Incomplete  Culture, blood (routine x  2)     Status: None (Preliminary result)   Collection Time: 08/11/17  7:36 PM  Result Value Ref Range Status   Specimen Description BLOOD RIGHT ANTECUBITAL  Final   Special Requests   Final    BOTTLES DRAWN AEROBIC AND ANAEROBIC Blood Culture results may not be optimal  due to an excessive volume of blood received in culture bottles   Culture NO GROWTH 3 DAYS  Final   Report Status PENDING  Incomplete    IMAGING: Dg Chest 2 View  Result Date: 08/11/2017 CLINICAL DATA:  Cough for 6 years. EXAM: CHEST  2 VIEW COMPARISON:  03/10/2011 FINDINGS: Large loculated right lateral pleural effusion, possibly loculated anteriorly as well. Right lung airspace disease and left basilar opacities heart is normal size. No acute bony abnormality. IMPRESSION: Large loculated right pleural effusion. Right lung airspace disease concerning for pneumonia. Left basilar atelectasis or pneumonia. Electronically Signed   By: Rolm Baptise M.D.   On: 08/11/2017 15:09   Ct Chest W Contrast  Result Date: 08/11/2017 CLINICAL DATA:  Increased cough for 3 weeks. EXAM: CT CHEST WITH CONTRAST TECHNIQUE: Multidetector CT imaging of the chest was performed during intravenous contrast administration. CONTRAST:  88m ISOVUE-300 IOPAMIDOL (ISOVUE-300) INJECTION 61% COMPARISON:  None. FINDINGS: Cardiovascular: Heart size normal. No pericardial effusion. Coronary artery calcification is evident. No thoracic aortic aneurysm. Mediastinum/Nodes: Scattered small to upper normal lymph nodes are seen in the mediastinum. Index subcarinal lymph node measures 10 mm short axis on image 83 series 2. There is no hilar lymphadenopathy. The esophagus has normal imaging features. There is no axillary lymphadenopathy. Lungs/Pleura: Moderate to large loculated right pleural fluid collections shows rim enhancement and contains debris and gas. There is adjacent compressive atelectasis with posterior right upper lobe pulmonary nodules measuring up to 7 mm. Marked  bronchial wall thickening is noted in the lungs bilaterally with airway impaction in tree-in-bud nodularity in the left lower lobe transitioning into a areas of more confluent airspace opacity in the posterior left base. Upper Abdomen: 6 mm hypervascular lesion noted posterior right liver, too small to characterize. Otherwise unremarkable. Musculoskeletal: Bone windows reveal no worrisome lytic or sclerotic osseous lesions. IMPRESSION: 1. Small to moderate loculated right pleural fluid collection containing debris and gas. Imaging features compatible with empyema. 2. Several small pulmonary nodules posterior right upper lobe. Attention on follow-up imaging recommended. 3. Bronchial wall thickening with small airway impaction, tree-in-bud nodularity and areas of confluent airspace disease in the left lower lobe. Imaging features suggest atypical infection. 4. Areas of collapse/consolidation in the right middle and lower lobes. Electronically Signed   By: EMisty StanleyM.D.   On: 08/11/2017 18:09    Assessment:   SMAINOR HELLMANNis a 61y.o. male with hx ETOH and tobacco abuse admitted with cough x 3 weeks and weight loss, weakness. 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   He is HIV negative.  Has been seen by Dr OGenevive Bi  Recommendations Perc drain ordered - will send for routine, fungal and afb culture Dc levofloxacin Will need prolonged oral abx most likely.  Continue zosyn for now.   Thank you very much for allowing me to participate in the care of this patient. Please call with questions.   DCheral Marker FOla Spurr MD

## 2017-08-14 NOTE — Progress Notes (Signed)
Adam Boyd NAME: Adam Boyd    MR#:  [id number]  DATE OF BIRTH:  Mar 23, 1956  SUBJECTIVE:  CHIEF COMPLAINT:   Chief Complaint  Patient presents with  . Cough   The patient was agitated last night due to withdrawal, given Ativan.  He is sleeping without responsive. REVIEW OF SYSTEMS:  Review of Systems  Unable to perform ROS: Medical condition    DRUG ALLERGIES:  No Known Allergies VITALS:  Blood pressure (!) 101/58, pulse 86, temperature 98.1 F (36.7 C), temperature source Oral, resp. rate 20, height 5\' 7"  (1.702 m), weight 122 lb 12.8 oz (55.7 kg), SpO2 98 %. PHYSICAL EXAMINATION:  Physical Exam  Constitutional: He is oriented to person, place, and time and well-developed, well-nourished, and in no distress.  HENT:  Head: Normocephalic.  Mouth/Throat: Oropharynx is clear and moist.  Eyes: Conjunctivae and EOM are normal. Pupils are equal, round, and reactive to light. No scleral icterus.  Neck: Normal range of motion. Neck supple. No JVD present. No tracheal deviation present.  Cardiovascular: Normal rate, regular rhythm and normal heart sounds. Exam reveals no gallop.  No murmur heard. Pulmonary/Chest: Effort normal and breath sounds normal. No respiratory distress. He has no wheezes. He has no rales.  Abdominal: Soft. Bowel sounds are normal. He exhibits no distension. There is no tenderness. There is no rebound.  Musculoskeletal: Normal range of motion. He exhibits no edema or tenderness.  Neurological: He is alert and oriented to person, place, and time. No cranial nerve deficit.  Skin: No rash noted. No erythema.  Psychiatric: Affect normal.   LABORATORY PANEL:  Male CBC Recent Labs  Lab 08/12/17 0505  WBC 9.8  HGB 11.3*  HCT 32.5*  PLT 665*   ------------------------------------------------------------------------------------------------------------------ Chemistries  Recent Labs  Lab  08/11/17 1624  08/14/17 0456  NA 128*   < > 131*  K 3.6   < > 3.7  CL 92*   < > 102  CO2 24   < > 19*  GLUCOSE 126*   < > 102*  BUN 13   < > 6  CREATININE 0.79   < > 0.74  CALCIUM 8.2*   < > 8.0*  MG  --   --  1.7  AST 113*  --   --   ALT 53  --   --   ALKPHOS 110  --   --   BILITOT 0.8  --   --    < > = values in this interval not displayed.   RADIOLOGY:  No results found. ASSESSMENT AND PLAN:   Pt is 61 year old white male with history of smoking presenting with cough and weight loss  1. Right-sided empyema. Continue Zosyn and Levaquin. ID consult. Dr. Genevive Bi  await the results of the percutaneous drain before any further recommendations are made.   2.  Hyponatremia possibly due to SIADH and or due to dehydration  Continue normal saline  3.  Nicotine abuse smoking cessation provided, on nicotine patch.  Alcohol abuse and withdrawal.  On CIWA protocol.  All the records are reviewed and case discussed with Care Management/Social Worker. Management plans discussed with the patient, his sisters and they are in agreement.  CODE STATUS: Full Code  TOTAL TIME TAKING CARE OF THIS PATIENT: 32 minutes.   More than 50% of the time was spent in counseling/coordination of care: YES  POSSIBLE D/C IN 2-3 DAYS, DEPENDING ON CLINICAL CONDITION.   Adam Boyd  Adam Boyd M.D on 08/14/2017 at 2:25 PM  Between 7am to 6pm - Pager - [phone]  After 6pm go to [email or web address] - [facility]

## 2017-08-15 ENCOUNTER — Inpatient Hospital Stay: Payer: Medicaid Other

## 2017-08-15 LAB — BASIC METABOLIC PANEL
ANION GAP: 8 (ref 5–15)
BUN: 7 mg/dL (ref 6–20)
CO2: 22 mmol/L (ref 22–32)
Calcium: 8 mg/dL — ABNORMAL LOW (ref 8.9–10.3)
Chloride: 105 mmol/L (ref 101–111)
Creatinine, Ser: 0.77 mg/dL (ref 0.61–1.24)
Glucose, Bld: 119 mg/dL — ABNORMAL HIGH (ref 65–99)
POTASSIUM: 3.6 mmol/L (ref 3.5–5.1)
SODIUM: 135 mmol/L (ref 135–145)

## 2017-08-15 LAB — BLOOD CULTURE ID PANEL (REFLEXED)
Acinetobacter baumannii: NOT DETECTED
CANDIDA GLABRATA: NOT DETECTED
CANDIDA KRUSEI: NOT DETECTED
Candida albicans: NOT DETECTED
Candida parapsilosis: NOT DETECTED
Candida tropicalis: NOT DETECTED
ENTEROBACTER CLOACAE COMPLEX: NOT DETECTED
ENTEROBACTERIACEAE SPECIES: NOT DETECTED
ENTEROCOCCUS SPECIES: NOT DETECTED
ESCHERICHIA COLI: NOT DETECTED
Haemophilus influenzae: NOT DETECTED
Klebsiella oxytoca: NOT DETECTED
Klebsiella pneumoniae: NOT DETECTED
LISTERIA MONOCYTOGENES: NOT DETECTED
Neisseria meningitidis: NOT DETECTED
PSEUDOMONAS AERUGINOSA: NOT DETECTED
Proteus species: NOT DETECTED
STAPHYLOCOCCUS AUREUS BCID: NOT DETECTED
STREPTOCOCCUS PNEUMONIAE: NOT DETECTED
STREPTOCOCCUS PYOGENES: NOT DETECTED
Serratia marcescens: NOT DETECTED
Staphylococcus species: NOT DETECTED
Streptococcus agalactiae: NOT DETECTED
Streptococcus species: NOT DETECTED

## 2017-08-15 LAB — PROTIME-INR
INR: 1.13
PROTHROMBIN TIME: 14.4 s (ref 11.4–15.2)

## 2017-08-15 LAB — APTT: aPTT: 40 seconds — ABNORMAL HIGH (ref 24–36)

## 2017-08-15 MED ORDER — LORAZEPAM 2 MG/ML IJ SOLN: 1.0000 mg | Freq: Four times a day (QID) | INTRAMUSCULAR | Status: AC | PRN

## 2017-08-15 MED ORDER — LIDOCAINE HCL (PF) 1 % IJ SOLN
INTRAMUSCULAR | Status: AC | PRN
  Administered 2017-08-15: 10 mL

## 2017-08-15 MED ORDER — SENNOSIDES-DOCUSATE SODIUM 8.6-50 MG PO TABS: 1.0000 | ORAL_TABLET | Freq: Every evening | ORAL | Status: DC | PRN

## 2017-08-15 MED ORDER — FENTANYL CITRATE (PF) 100 MCG/2ML IJ SOLN
INTRAMUSCULAR | Status: AC | PRN
  Administered 2017-08-15: 12.5 ug via INTRAVENOUS
  Administered 2017-08-15: 50 ug via INTRAVENOUS
  Administered 2017-08-15: 12.5 ug via INTRAVENOUS

## 2017-08-15 MED ORDER — BISACODYL 5 MG PO TBEC: 5.0000 mg | DELAYED_RELEASE_TABLET | Freq: Every day | ORAL | Status: DC | PRN

## 2017-08-15 MED ORDER — LORAZEPAM 1 MG PO TABS: 1.0000 mg | ORAL_TABLET | Freq: Four times a day (QID) | ORAL | Status: AC | PRN

## 2017-08-15 MED ORDER — MIDAZOLAM HCL 5 MG/5ML IJ SOLN
INTRAMUSCULAR | Status: AC | PRN
  Administered 2017-08-15 (×2): 1 mg via INTRAVENOUS

## 2017-08-15 MED ORDER — SODIUM CHLORIDE 0.9 % IV SOLN
INTRAVENOUS | Status: AC | PRN
  Administered 2017-08-15: 100 mL/h via INTRAVENOUS

## 2017-08-15 MED ORDER — VANCOMYCIN HCL IN DEXTROSE 1-5 GM/200ML-% IV SOLN
1000.0000 mg | Freq: Two times a day (BID) | INTRAVENOUS | Status: DC
  Filled 2017-08-15: qty 200

## 2017-08-15 MED ORDER — SODIUM CHLORIDE 0.9 % IV SOLN
INTRAVENOUS | Status: DC
  Administered 2017-08-15 – 2017-08-16 (×2): via INTRAVENOUS

## 2017-08-15 MED ORDER — FENTANYL CITRATE (PF) 100 MCG/2ML IJ SOLN
INTRAMUSCULAR | Status: AC
  Filled 2017-08-15: qty 4

## 2017-08-15 MED ORDER — VANCOMYCIN HCL IN DEXTROSE 1-5 GM/200ML-% IV SOLN
1000.0000 mg | Freq: Once | INTRAVENOUS | Status: AC
  Administered 2017-08-15: 1000 mg via INTRAVENOUS
  Filled 2017-08-15: qty 200

## 2017-08-15 MED ORDER — MIDAZOLAM HCL 5 MG/5ML IJ SOLN
INTRAMUSCULAR | Status: AC
Start: 2017-08-15 — End: 2017-08-16
  Filled 2017-08-15: qty 5

## 2017-08-15 MED ORDER — VANCOMYCIN HCL IN DEXTROSE 750-5 MG/150ML-% IV SOLN
750.0000 mg | Freq: Two times a day (BID) | INTRAVENOUS | Status: DC
  Administered 2017-08-16: 750 mg via INTRAVENOUS
  Filled 2017-08-15 (×3): qty 150

## 2017-08-15 NOTE — Progress Notes (Signed)
Pharmacy Antibiotic Note  Adam Boyd is a 61 y.o. male admitted on 08/11/2017 with bacteremia.  Pharmacy has been consulted for vancomycin dosing.  Plan: Vancomycin 750mg  IV every 12 hours.  Goal trough 15-20 mcg/mL. Vancomycin trough 12/7@2030   Height: 5\' 7"  (170.2 cm) Weight: 122 lb 12.8 oz (55.7 kg) IBW/kg (Calculated) : 66.1  Temp (24hrs), Avg:98.4 F (36.9 C), Min:98 F (36.7 C), Max:99 F (37.2 C)  Recent Labs  Lab 08/11/17 1624 08/11/17 1625 08/12/17 0505 08/14/17 0456 08/15/17 0332  WBC 12.6*  --  9.8  --   --   CREATININE 0.79  --  0.66 0.74 0.77  LATICACIDVEN  --  1.4  --   --   --     Estimated Creatinine Clearance: 74.5 mL/min (by C-G formula based on SCr of 0.77 mg/dL).    No Known Allergies  Antimicrobials this admission: Anti-infectives (From admission, onward)   Start     Dose/Rate Route Frequency Ordered Stop   08/16/17 0900  vancomycin (VANCOCIN) IVPB 1000 mg/200 mL premix  Status:  Discontinued     1,000 mg 200 mL/hr over 60 Minutes Intravenous Every 12 hours 08/15/17 2000 08/15/17 2002   08/16/17 0900  vancomycin (VANCOCIN) IVPB 750 mg/150 ml premix     750 mg 150 mL/hr over 60 Minutes Intravenous Every 12 hours 08/15/17 2002     08/15/17 2000  vancomycin (VANCOCIN) IVPB 1000 mg/200 mL premix     1,000 mg 200 mL/hr over 60 Minutes Intravenous  Once 08/15/17 1959     08/12/17 2000  levofloxacin (LEVAQUIN) IVPB 750 mg  Status:  Discontinued     750 mg 100 mL/hr over 90 Minutes Intravenous Every 24 hours 08/11/17 1947 08/15/17 0856   08/12/17 0000  piperacillin-tazobactam (ZOSYN) IVPB 3.375 g     3.375 g 12.5 mL/hr over 240 Minutes Intravenous Every 8 hours 08/11/17 1947     08/11/17 1945  levofloxacin (LEVAQUIN) IVPB 500 mg  Status:  Discontinued     500 mg 100 mL/hr over 60 Minutes Intravenous Every 24 hours 08/11/17 1940 08/11/17 1940   08/11/17 1915  piperacillin-tazobactam (ZOSYN) IVPB 4.5 g  Status:  Discontinued     4.5 g 200 mL/hr  over 30 Minutes Intravenous  Once 08/11/17 1905 08/11/17 1914   08/11/17 1915  levofloxacin (LEVAQUIN) IVPB 750 mg     750 mg 100 mL/hr over 90 Minutes Intravenous  Once 08/11/17 1905 08/12/17 0201   08/11/17 1915  piperacillin-tazobactam (ZOSYN) 4.5 g in sodium chloride 0.9 % 100 mL injection     200 mL/hr over 30 Minutes Intravenous  Once 08/11/17 1914 08/12/17 0003   08/11/17 1911  piperacillin-tazobactam (ZOSYN) 3.375 GM/50ML IVPB  Status:  Discontinued    Comments:  Bumgarner, Katharine: cabinet override      08/11/17 1911 08/11/17 1934      Microbiology results: Recent Results (from the past 240 hour(s))  Culture, blood (routine x 2)     Status: None (Preliminary result)   Collection Time: 08/11/17  7:09 PM  Result Value Ref Range Status   Specimen Description BLOOD RIGHT FOREARM  Final   Special Requests   Final    BOTTLES DRAWN AEROBIC AND ANAEROBIC Blood Culture adequate volume   Culture NO GROWTH 4 DAYS  Final   Report Status PENDING  Incomplete  Culture, blood (routine x 2)     Status: None (Preliminary result)   Collection Time: 08/11/17  7:36 PM  Result Value Ref Range Status  Specimen Description BLOOD RIGHT ANTECUBITAL  Final   Special Requests   Final    BOTTLES DRAWN AEROBIC AND ANAEROBIC Blood Culture results may not be optimal due to an excessive volume of blood received in culture bottles   Culture  Setup Time   Final    GRAM POSITIVE COCCI AEROBIC BOTTLE ONLY CRITICAL RESULT CALLED TO, READ BACK BY AND VERIFIED WITH: DAVID BESANTI AT 0522 08/15/17 SDR GRAM STAIN REVIEWED-AGREE WITH RESULT Performed at [facility], [place] [address], [place], [place] [address]    Culture GRAM POSITIVE COCCI  Final   Report Status PENDING  Incomplete  Blood Culture ID Panel (Reflexed)     Status: None   Collection Time: 08/11/17  7:36 PM  Result Value Ref Range Status   Enterococcus species NOT DETECTED NOT DETECTED Final   Listeria monocytogenes NOT DETECTED NOT  DETECTED Final   Staphylococcus species NOT DETECTED NOT DETECTED Final   Staphylococcus aureus NOT DETECTED NOT DETECTED Final   Streptococcus species NOT DETECTED NOT DETECTED Final   Streptococcus agalactiae NOT DETECTED NOT DETECTED Final   Streptococcus pneumoniae NOT DETECTED NOT DETECTED Final   Streptococcus pyogenes NOT DETECTED NOT DETECTED Final   Acinetobacter baumannii NOT DETECTED NOT DETECTED Final   Enterobacteriaceae species NOT DETECTED NOT DETECTED Final   Enterobacter cloacae complex NOT DETECTED NOT DETECTED Final   Escherichia coli NOT DETECTED NOT DETECTED Final   Klebsiella oxytoca NOT DETECTED NOT DETECTED Final   Klebsiella pneumoniae NOT DETECTED NOT DETECTED Final   Proteus species NOT DETECTED NOT DETECTED Final   Serratia marcescens NOT DETECTED NOT DETECTED Final   Haemophilus influenzae NOT DETECTED NOT DETECTED Final   Neisseria meningitidis NOT DETECTED NOT DETECTED Final   Pseudomonas aeruginosa NOT DETECTED NOT DETECTED Final   Candida albicans NOT DETECTED NOT DETECTED Final   Candida glabrata NOT DETECTED NOT DETECTED Final   Candida krusei NOT DETECTED NOT DETECTED Final   Candida parapsilosis NOT DETECTED NOT DETECTED Final   Candida tropicalis NOT DETECTED NOT DETECTED Final     Thank you for allowing pharmacy to be a part of this patient's care.  Donna Christen Devoiry Corriher 08/15/2017 8:01 PM

## 2017-08-15 NOTE — Progress Notes (Addendum)
[facility] [place] NAME: Adam Boyd    MR#:  [id number]  DATE OF BIRTH:  [birth year]  SUBJECTIVE:  CHIEF COMPLAINT:   Chief Complaint  Patient presents with  . Cough   The patient is alert awake and oriented, has no complaints. REVIEW OF SYSTEMS:  Review of Systems  Constitutional: Negative for chills, fever and malaise/fatigue.  HENT: Negative for sore throat.   Eyes: Negative for blurred vision and double vision.  Respiratory: Negative for cough, hemoptysis, shortness of breath, wheezing and stridor.   Cardiovascular: Negative for chest pain, palpitations, orthopnea and leg swelling.  Gastrointestinal: Negative for abdominal pain, blood in stool, diarrhea, melena, nausea and vomiting.  Genitourinary: Negative for dysuria, flank pain and hematuria.  Musculoskeletal: Negative for back pain and joint pain.  Neurological: Negative for dizziness, sensory change, focal weakness, seizures, loss of consciousness, weakness and headaches.  Endo/Heme/Allergies: Negative for polydipsia.  Psychiatric/Behavioral: Negative for depression. The patient is not nervous/anxious.     DRUG ALLERGIES:  No Known Allergies VITALS:  Blood pressure 112/67, pulse 68, temperature 98.3 F (36.8 C), temperature source Oral, resp. rate (!) 22, height 5\' 7"  (1.702 m), weight 122 lb 12.8 oz (55.7 kg), SpO2 98 %. PHYSICAL EXAMINATION:  Physical Exam  Constitutional: He is oriented to person, place, and time and well-developed, well-nourished, and in no distress.  HENT:  Head: Normocephalic.  Mouth/Throat: Oropharynx is clear and moist.  Eyes: Conjunctivae and EOM are normal. Pupils are equal, round, and reactive to light. No scleral icterus.  Neck: Normal range of motion. Neck supple. No JVD present. No tracheal deviation present.  Cardiovascular: Normal rate, regular rhythm and normal heart sounds. Exam reveals no gallop.  No murmur  heard. Pulmonary/Chest: Effort normal and breath sounds normal. No respiratory distress. He has no wheezes. He has no rales.  Abdominal: Soft. Bowel sounds are normal. He exhibits no distension. There is no tenderness. There is no rebound.  Musculoskeletal: Normal range of motion. He exhibits no edema or tenderness.  Neurological: He is alert and oriented to person, place, and time. No cranial nerve deficit.  Skin: No rash noted. No erythema.  Psychiatric: Affect normal.   LABORATORY PANEL:  Male CBC Recent Labs  Lab 08/12/17 0505  WBC 9.8  HGB 11.3*  HCT 32.5*  PLT 665*   ------------------------------------------------------------------------------------------------------------------ Chemistries  Recent Labs  Lab 08/11/17 1624  08/14/17 0456 08/15/17 0332  NA 128*   < > 131* 135  K 3.6   < > 3.7 3.6  CL 92*   < > 102 105  CO2 24   < > 19* 22  GLUCOSE 126*   < > 102* 119*  BUN 13   < > 6 7  CREATININE 0.79   < > 0.74 0.77  CALCIUM 8.2*   < > 8.0* 8.0*  MG  --   --  1.7  --   AST 113*  --   --   --   ALT 53  --   --   --   ALKPHOS 110  --   --   --   BILITOT 0.8  --   --   --    < > = values in this interval not displayed.   RADIOLOGY:  No results found. ASSESSMENT AND PLAN:   Pt is 61 year old white male with history of smoking presenting with cough and weight loss  1. Right-sided empyema. Continue Zosyn and discontinue Levaquin per  Dr. Ola Spurr. Dr. Genevive Bi  await the results of the percutaneous drain before any further recommendations are made.  Per radiologist, plan for right chest tube placement with moderate sedation.  2.  Hyponatremia possibly due to SIADH and or due to dehydration  Improved with normal saline IV.  3.  Nicotine abuse smoking cessation provided, on nicotine patch.  Alcohol abuse and withdrawal.  On CIWA protocol.  All the records are reviewed and case discussed with Care Management/Social Worker. Management plans discussed with the  patient, his sisters and they are in agreement.  CODE STATUS: Full Code  TOTAL TIME TAKING CARE OF THIS PATIENT: 32 minutes.   More than 50% of the time was spent in counseling/coordination of care: YES  POSSIBLE D/C IN 2 DAYS, DEPENDING ON CLINICAL CONDITION.   Demetrios Loll M.D on 08/15/2017 at 2:56 PM  Between 7am to 6pm - Pager - [phone]  After 6pm go to [email or web address] - [facility]

## 2017-08-15 NOTE — Procedures (Signed)
CT guided placement of 12 Fr right chest tube.  Removed thick yellow fluid.  Send fluid for analysis.  Minimal blood loss and no immediate complication.

## 2017-08-15 NOTE — Consult Note (Signed)
Chief Complaint: Patient was seen in consultation today for  Chief Complaint  Patient presents with  . Cough   at the request of Dr. Nestor Lewandowsky  Referring Physician(s): Nestor Lewandowsky   Patient Status: [facility] - In-pt  History of Present Illness: Adam Boyd is a 61 y.o. male with a cough for 3-4 weeks. The patient has also been recently confused. In addition, the patient had some weight loss. Patient was brought to the emergency department and was admitted. Imaging has demonstrated a large complex right pleural effusion containing pleural air. Findings are concerning for an empyema. Request for image guided chest tube placement. Patient has no complaints at this time. Patient is currently not confused and understands what is going on. The patient's sister is with him.  Past Medical History:  Diagnosis Date  . Arthritis    bad back, pinched nerve  . Asymmetrical sensorineural hearing loss 2012   Right ear (odd appearance), s/p eval by ENT and MRI, never followed up.  will recommend pt f/u.  Marland Kitchen Depression   . History of chicken pox   . Hx: UTI (urinary tract infection)   . Smoker     Past Surgical History:  Procedure Laterality Date  . CATARACT EXTRACTION     bilateral, 5 years ago  . 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  scattere small foci of hyperintensity (sequela or chronic small vessel ischemia vs demyelinating disorder, mild fluid R mastoid air cells, paranasal sinus disease    Allergies: Patient has no known allergies.  Medications: Prior to Admission medications   Medication Sig Start Date End Date Taking? Authorizing Provider  acetaminophen (TYLENOL) 500 MG tablet Take 1,000 mg by mouth every 6 (six) hours as needed.  03/10/11  Yes [name], MD     Family History  Problem Relation Age of Onset  . Hypertension Mother   . Diabetes Mother   . Stroke Paternal Grandmother    . Coronary artery disease Neg Hx   . Cancer Neg Hx     Social History   Socioeconomic History  . Marital status: Married    Spouse name: None  . Number of children: 2  . Years of education: assoc deg  . Highest education level: None  Social Needs  . Financial resource strain: None  . Food insecurity - worry: None  . Food insecurity - inability: None  . Transportation needs - medical: None  . Transportation needs - non-medical: None  Occupational History  . Occupation: Surveyor, quantity: Marketing executive  Tobacco Use  . Smoking status: Current Every Day Smoker    Packs/day: 1.00    Years: 42.00    Pack years: 42.00    Types: Cigarettes  . Smokeless tobacco: Never Used  Substance and Sexual Activity  . Alcohol use: Yes    Comment: 1-2 beers/day  . Drug use: No  . Sexual activity: None  Other Topics Concern  . None  Social History Narrative   Caffeine: 1-2 cups/ coffee   Lives with wife, 63yo son, 2 dogs   Edu: Assoc Degree   Activity: none   Diet: fair.  Water.  Fruits/vegetables.   Review of Systems  Constitutional: Positive for unexpected weight change.  Respiratory: Positive for shortness of breath.   Cardiovascular: Negative for chest pain.  Gastrointestinal: Negative.   Genitourinary: Negative.  Vital Signs: BP 121/64 (BP Location: Right Arm)   Pulse 73   Temp 98.3 F (36.8 C) (Oral)   Resp 20   Ht 5\' 7"  (1.702 m)   Wt 122 lb 12.8 oz (55.7 kg)   SpO2 99%   BMI 19.23 kg/m   Physical Exam  Constitutional: No distress.  HENT:  Mouth/Throat: Oropharynx is clear and moist.  Cardiovascular: Normal rate, regular rhythm and normal heart sounds.  Pulmonary/Chest: Effort normal.  Minimal breath sounds in the right lung. This is expected due to the large right pleural effusion.  Left lung is clear.  Abdominal: Soft. He exhibits no distension. There is no tenderness.  Musculoskeletal: He exhibits no edema.    Imaging: Dg Chest 2  View  Result Date: 08/11/2017 CLINICAL DATA:  Cough for 6 years. EXAM: CHEST  2 VIEW COMPARISON:  03/10/2011 FINDINGS: Large loculated right lateral pleural effusion, possibly loculated anteriorly as well. Right lung airspace disease and left basilar opacities heart is normal size. No acute bony abnormality. IMPRESSION: Large loculated right pleural effusion. Right lung airspace disease concerning for pneumonia. Left basilar atelectasis or pneumonia. Electronically Signed   By: Rolm Baptise M.D.   On: 08/11/2017 15:09   Ct Chest W Contrast  Result Date: 08/11/2017 CLINICAL DATA:  Increased cough for 3 weeks. EXAM: CT CHEST WITH CONTRAST TECHNIQUE: Multidetector CT imaging of the chest was performed during intravenous contrast administration. CONTRAST:  1mL ISOVUE-300 IOPAMIDOL (ISOVUE-300) INJECTION 61% COMPARISON:  None. FINDINGS: Cardiovascular: Heart size normal. No pericardial effusion. Coronary artery calcification is evident. No thoracic aortic aneurysm. Mediastinum/Nodes: Scattered small to upper normal lymph nodes are seen in the mediastinum. Index subcarinal lymph node measures 10 mm short axis on image 83 series 2. There is no hilar lymphadenopathy. The esophagus has normal imaging features. There is no axillary lymphadenopathy. Lungs/Pleura: Moderate to large loculated right pleural fluid collections shows rim enhancement and contains debris and gas. There is adjacent compressive atelectasis with posterior right upper lobe pulmonary nodules measuring up to 7 mm. Marked bronchial wall thickening is noted in the lungs bilaterally with airway impaction in tree-in-bud nodularity in the left lower lobe transitioning into a areas of more confluent airspace opacity in the posterior left base. Upper Abdomen: 6 mm hypervascular lesion noted posterior right liver, too small to characterize. Otherwise unremarkable. Musculoskeletal: Bone windows reveal no worrisome lytic or sclerotic osseous lesions.  IMPRESSION: 1. Small to moderate loculated right pleural fluid collection containing debris and gas. Imaging features compatible with empyema. 2. Several small pulmonary nodules posterior right upper lobe. Attention on follow-up imaging recommended. 3. Bronchial wall thickening with small airway impaction, tree-in-bud nodularity and areas of confluent airspace disease in the left lower lobe. Imaging features suggest atypical infection. 4. Areas of collapse/consolidation in the right middle and lower lobes. Electronically Signed   By: Misty Stanley M.D.   On: 08/11/2017 18:09    Labs:  CBC: Recent Labs    08/11/17 1624 08/12/17 0505  WBC 12.6* 9.8  HGB 12.1* 11.3*  HCT 35.5* 32.5*  PLT 674* 665*    COAGS: Recent Labs    08/15/17 1017  INR 1.13  APTT 40*    BMP: Recent Labs    08/11/17 1624 08/12/17 0505 08/14/17 0456 08/15/17 0332  NA 128* 130* 131* 135  K 3.6 4.1 3.7 3.6  CL 92* 99* 102 105  CO2 24 22 19* 22  GLUCOSE 126* 112* 102* 119*  BUN 13 9 6 7   CALCIUM 8.2*  7.8* 8.0* 8.0*  CREATININE 0.79 0.66 0.74 0.77  GFRNONAA >60 >60 >60 >60  GFRAA >60 >60 >60 >60    LIVER FUNCTION TESTS: Recent Labs    08/11/17 1624  BILITOT 0.8  AST 113*  ALT 53  ALKPHOS 110  PROT 7.8  ALBUMIN 2.3*    TUMOR MARKERS: No results for input(s): AFPTM, CEA, CA199, CHROMGRNA in the last 8760 hours.  Assessment and Plan:  61 year old with a large complex right pleural effusion. Findings are suggestive for an empyema. Request for an image guided chest tube placement. Depending on the fluid analysis, patient may be a candidate for intrapleural thrombolytic therapy. CT-guided chest tube placement was discussed with the patient and his sister. The sister is the POA and she was able to give consent. Plan for right chest tube placement with moderate sedation.  Thank you for this interesting consult.  I greatly enjoyed meeting HOBERT POPLASKI and look forward to participating in their  care.  A copy of this report was sent to the requesting provider on this date.  Electronically Signed: Burman Riis, MD 08/15/2017, 2:11 PM   I spent a total of 20 Minutes    in face to face in clinical consultation, greater than 50% of which was counseling/coordinating care for CT-guided chest tube placement.

## 2017-08-15 NOTE — Progress Notes (Signed)
[facility] PROGRESS NOTE Date of Admission:  08/11/2017     ID: Adam Boyd is a 61 y.o. male with  empyema Active Problems:   Empyema ([place])   Subjective: Drain placed with large amt yellow pus   ROS  Eleven systems are reviewed and negative except per hpi  Medications:  Antibiotics Given (last 72 hours)    Date/Time Action Medication Dose Rate   08/12/17 2048 New Bag/Given   levofloxacin (LEVAQUIN) IVPB 750 mg 750 mg 100 mL/hr   08/12/17 2300 New Bag/Given   piperacillin-tazobactam (ZOSYN) IVPB 3.375 g 3.375 g 12.5 mL/hr   08/13/17 0554 New Bag/Given   piperacillin-tazobactam (ZOSYN) IVPB 3.375 g 3.375 g 12.5 mL/hr   08/13/17 1358 New Bag/Given   piperacillin-tazobactam (ZOSYN) IVPB 3.375 g 3.375 g 12.5 mL/hr   08/13/17 2011 New Bag/Given   levofloxacin (LEVAQUIN) IVPB 750 mg 750 mg 100 mL/hr   08/13/17 2236 New Bag/Given   piperacillin-tazobactam (ZOSYN) IVPB 3.375 g 3.375 g 12.5 mL/hr   08/14/17 0604 New Bag/Given   piperacillin-tazobactam (ZOSYN) IVPB 3.375 g 3.375 g 12.5 mL/hr   08/14/17 1335 New Bag/Given   piperacillin-tazobactam (ZOSYN) IVPB 3.375 g 3.375 g 12.5 mL/hr   08/14/17 1952 New Bag/Given   levofloxacin (LEVAQUIN) IVPB 750 mg 750 mg 100 mL/hr   08/14/17 2139 New Bag/Given   piperacillin-tazobactam (ZOSYN) IVPB 3.375 g 3.375 g 12.5 mL/hr   08/15/17 0532 New Bag/Given   piperacillin-tazobactam (ZOSYN) IVPB 3.375 g 3.375 g 12.5 mL/hr     . feeding supplement (ENSURE ENLIVE)  237 mL Oral TID BM  . fentaNYL      . folic acid  1 mg Oral Daily  . guaiFENesin  600 mg Oral BID  . heparin  5,000 Units Subcutaneous Q8H  . midazolam      . multivitamin with minerals  1 tablet Oral Daily  . nicotine  21 mg Transdermal Daily  . thiamine  100 mg Oral Daily   Or  . thiamine  100 mg Intravenous Daily    Objective: Vital signs in last 24 hours: Temp:  [98 F (36.7 C)-99 F (37.2 C)] 98.3 F (36.8 C) (12/05 1205) Pulse Rate:   [66-84] 68 (12/05 1458) Resp:  [19-26] 20 (12/05 1458) BP: (102-121)/(57-75) 110/64 (12/05 1458) SpO2:  [95 %-99 %] 98 % (12/05 1458) Constitutional:thin confused HENT: anicteric Mouth/Throat: Oropharynx is clear and moist. No oropharyngeal exudate.  Cardiovascular: Normal rate, regular rhythm and normal heart sounds. Exam reveals no gallop and no friction rub.  No murmur heard.  Pulmonary/Chest: Decreased BS on R.  CT with yellow purulent drainage  Abdominal: Soft. Bowel sounds are normal. He exhibits no distension. There is no tenderness.  Lymphadenopathy:  He has no cervical adenopathy.  Neurological: He is alert and oriented to person, place, and time.  Skin: Skin is warm and dry. No rash noted. No erythema.  Psychiatric: He has a normal mood and affect. His behavior is normal.      Lab Results Recent Labs    08/14/17 0456 08/15/17 0332  NA 131* 135  K 3.7 3.6  CL 102 105  CO2 19* 22  BUN 6 7  CREATININE 0.74 0.77    Microbiology: Results for orders placed or performed during the hospital encounter of 08/11/17  Culture, blood (routine x 2)     Status: None (Preliminary result)   Collection Time: 08/11/17  7:09 PM  Result Value Ref Range Status   Specimen Description BLOOD RIGHT  FOREARM  Final   Special Requests   Final    BOTTLES DRAWN AEROBIC AND ANAEROBIC Blood Culture adequate volume   Culture NO GROWTH 4 DAYS  Final   Report Status PENDING  Incomplete  Culture, blood (routine x 2)     Status: None (Preliminary result)   Collection Time: 08/11/17  7:36 PM  Result Value Ref Range Status   Specimen Description BLOOD RIGHT ANTECUBITAL  Final   Special Requests   Final    BOTTLES DRAWN AEROBIC AND ANAEROBIC Blood Culture results may not be optimal due to an excessive volume of blood received in culture bottles   Culture  Setup Time   Final    GRAM POSITIVE COCCI AEROBIC BOTTLE ONLY CRITICAL RESULT CALLED TO, READ BACK BY AND VERIFIED WITH: Lenorris Karger BESANTI AT  0522 08/15/17 SDR GRAM STAIN REVIEWED-AGREE WITH RESULT Performed at [facility], [place] [address] [place], [place] [address]    Culture GRAM POSITIVE COCCI  Final   Report Status PENDING  Incomplete  Blood Culture ID Panel (Reflexed)     Status: None   Collection Time: 08/11/17  7:36 PM  Result Value Ref Range Status   Enterococcus species NOT DETECTED NOT DETECTED Final   Listeria monocytogenes NOT DETECTED NOT DETECTED Final   Staphylococcus species NOT DETECTED NOT DETECTED Final   Staphylococcus aureus NOT DETECTED NOT DETECTED Final   Streptococcus species NOT DETECTED NOT DETECTED Final   Streptococcus agalactiae NOT DETECTED NOT DETECTED Final   Streptococcus pneumoniae NOT DETECTED NOT DETECTED Final   Streptococcus pyogenes NOT DETECTED NOT DETECTED Final   Acinetobacter baumannii NOT DETECTED NOT DETECTED Final   Enterobacteriaceae species NOT DETECTED NOT DETECTED Final   Enterobacter cloacae complex NOT DETECTED NOT DETECTED Final   Escherichia coli NOT DETECTED NOT DETECTED Final   Klebsiella oxytoca NOT DETECTED NOT DETECTED Final   Klebsiella pneumoniae NOT DETECTED NOT DETECTED Final   Proteus species NOT DETECTED NOT DETECTED Final   Serratia marcescens NOT DETECTED NOT DETECTED Final   Haemophilus influenzae NOT DETECTED NOT DETECTED Final   Neisseria meningitidis NOT DETECTED NOT DETECTED Final   Pseudomonas aeruginosa NOT DETECTED NOT DETECTED Final   Candida albicans NOT DETECTED NOT DETECTED Final   Candida glabrata NOT DETECTED NOT DETECTED Final   Candida krusei NOT DETECTED NOT DETECTED Final   Candida parapsilosis NOT DETECTED NOT DETECTED Final   Candida tropicalis NOT DETECTED NOT DETECTED Final    Studies/Results: No results found.  Assessment/Plan: Adam Boyd is a 61 y.o. male with hx ETOH and tobacco abuse admitted with cough x 3 weeks and weight loss, weakness. 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   He is HIV negative.   Has been seen by Dr Genevive Bi. CT drain placed.  Recommendations.  Continue zosyn for now.  Add back vanco given GPC on bcx. WIll need prolonged abx therapy but hopefully oral  Thank you very much for the consult. Will follow with you.  Leonel Ramsay   08/15/2017, 3:36 PM

## 2017-08-15 NOTE — Consult Note (Signed)
Pharmacy Antibiotic Note  Adam Boyd is a 61 y.o. male admitted on 08/11/2017 with empyema.  Pharmacy has been consulted for zosyn dosing.  Plan: Continue zosyn 3.375g q 8hr EI infusion  Height: 5\' 7"  (170.2 cm) Weight: 122 lb 12.8 oz (55.7 kg) IBW/kg (Calculated) : 66.1  Temp (24hrs), Avg:98.5 F (36.9 C), Min:98 F (36.7 C), Max:99 F (37.2 C)  Recent Labs  Lab 08/11/17 1624 08/11/17 1625 08/12/17 0505 08/14/17 0456 08/15/17 0332  WBC 12.6*  --  9.8  --   --   CREATININE 0.79  --  0.66 0.74 0.77  LATICACIDVEN  --  1.4  --   --   --     Estimated Creatinine Clearance: 74.5 mL/min (by C-G formula based on SCr of 0.77 mg/dL).    No Known Allergies  Antimicrobials this admission: levofloxacin 12/1 >> 12/4 zosyn 12/1 >>   Dose adjustments this admission:   Microbiology results: 12/1 BCx: 1/2 GPC   Thank you for allowing pharmacy to be a part of this patient's care.  Ramond Dial, Pharm.D, BCPS Clinical Pharmacist  08/15/2017 1:00 PM

## 2017-08-16 ENCOUNTER — Inpatient Hospital Stay: Payer: Medicaid Other

## 2017-08-16 LAB — CULTURE, BLOOD (ROUTINE X 2)
CULTURE: NO GROWTH
SPECIAL REQUESTS: ADEQUATE

## 2017-08-16 LAB — PHOSPHORUS: Phosphorus: 3.7 mg/dL (ref 2.5–4.6)

## 2017-08-16 LAB — ACID FAST SMEAR (AFB): ACID FAST SMEAR - AFSCU2: NEGATIVE

## 2017-08-16 LAB — MAGNESIUM: Magnesium: 1.8 mg/dL (ref 1.7–2.4)

## 2017-08-16 LAB — POTASSIUM: POTASSIUM: 3.7 mmol/L (ref 3.5–5.1)

## 2017-08-16 MED ORDER — SODIUM CHLORIDE 0.9 % IV SOLN
3.0000 g | Freq: Four times a day (QID) | INTRAVENOUS | Status: DC
  Administered 2017-08-16 – 2017-08-21 (×18): 3 g via INTRAVENOUS
  Filled 2017-08-16 (×21): qty 3

## 2017-08-16 MED ORDER — SODIUM CHLORIDE 0.9 % IJ SOLN
Freq: Once | INTRAMUSCULAR | Status: AC
  Administered 2017-08-16: 08:00:00 via INTRAPLEURAL
  Filled 2017-08-16: qty 10

## 2017-08-16 NOTE — Progress Notes (Signed)
ANTIBIOTIC CONSULT NOTE - INITIAL  Pharmacy Consult for Unasyn Indication: empyema  No Known Allergies  Patient Measurements: Height: 5\' 7"  (170.2 cm) Weight: 122 lb 12.8 oz (55.7 kg) IBW/kg (Calculated) : 66.1 Adjusted Body Weight:   Vital Signs: Temp: 98.3 F (36.8 C) (12/06 1324) Temp Source: Oral (12/06 1324) BP: 102/63 (12/06 1735) Pulse Rate: 71 (12/06 1735) Intake/Output from previous day: 12/05 0701 - 12/06 0700 In: 1839 [P.O.:120; I.V.:1569; IV Piggyback:150] Out: 2365 [XLKGM:0102; Chest Tube:890] Intake/Output from this shift: Total I/O In: 680 [P.O.:480; IV Piggyback:200] Out: 300 [Urine:300]  Labs: Recent Labs    08/14/17 0456 08/15/17 0332  CREATININE 0.74 0.77   Estimated Creatinine Clearance: 74.5 mL/min (by C-G formula based on SCr of 0.77 mg/dL). No results for input(s): VANCOTROUGH, VANCOPEAK, VANCORANDOM, GENTTROUGH, GENTPEAK, GENTRANDOM, TOBRATROUGH, TOBRAPEAK, TOBRARND, AMIKACINPEAK, AMIKACINTROU, AMIKACIN in the last 72 hours.   Microbiology: Recent Results (from the past 720 hour(s))  Culture, blood (routine x 2)     Status: None   Collection Time: 08/11/17  7:09 PM  Result Value Ref Range Status   Specimen Description BLOOD RIGHT FOREARM  Final   Special Requests   Final    BOTTLES DRAWN AEROBIC AND ANAEROBIC Blood Culture adequate volume   Culture NO GROWTH 5 DAYS  Final   Report Status 08/16/2017 FINAL  Final  Culture, blood (routine x 2)     Status: Abnormal   Collection Time: 08/11/17  7:36 PM  Result Value Ref Range Status   Specimen Description BLOOD RIGHT ANTECUBITAL  Final   Special Requests   Final    BOTTLES DRAWN AEROBIC AND ANAEROBIC Blood Culture results may not be optimal due to an excessive volume of blood received in culture bottles   Culture  Setup Time   Final    GRAM POSITIVE COCCI AEROBIC BOTTLE ONLY CRITICAL RESULT CALLED TO, READ BACK BY AND VERIFIED WITH: DAVID BESANTI AT 0522 08/15/17 SDR GRAM STAIN  REVIEWED-AGREE WITH RESULT    Culture (A)  Final    MICROCOCCUS LUTEUS/LYLAE Standardized susceptibility testing for this organism is not available. Performed at [facility], [place] [address], [place], [place] [address]    Report Status 08/16/2017 FINAL  Final  Blood Culture ID Panel (Reflexed)     Status: None   Collection Time: 08/11/17  7:36 PM  Result Value Ref Range Status   Enterococcus species NOT DETECTED NOT DETECTED Final   Listeria monocytogenes NOT DETECTED NOT DETECTED Final   Staphylococcus species NOT DETECTED NOT DETECTED Final   Staphylococcus aureus NOT DETECTED NOT DETECTED Final   Streptococcus species NOT DETECTED NOT DETECTED Final   Streptococcus agalactiae NOT DETECTED NOT DETECTED Final   Streptococcus pneumoniae NOT DETECTED NOT DETECTED Final   Streptococcus pyogenes NOT DETECTED NOT DETECTED Final   Acinetobacter baumannii NOT DETECTED NOT DETECTED Final   Enterobacteriaceae species NOT DETECTED NOT DETECTED Final   Enterobacter cloacae complex NOT DETECTED NOT DETECTED Final   Escherichia coli NOT DETECTED NOT DETECTED Final   Klebsiella oxytoca NOT DETECTED NOT DETECTED Final   Klebsiella pneumoniae NOT DETECTED NOT DETECTED Final   Proteus species NOT DETECTED NOT DETECTED Final   Serratia marcescens NOT DETECTED NOT DETECTED Final   Haemophilus influenzae NOT DETECTED NOT DETECTED Final   Neisseria meningitidis NOT DETECTED NOT DETECTED Final   Pseudomonas aeruginosa NOT DETECTED NOT DETECTED Final   Candida albicans NOT DETECTED NOT DETECTED Final   Candida glabrata NOT DETECTED NOT DETECTED Final   Candida  krusei NOT DETECTED NOT DETECTED Final   Candida parapsilosis NOT DETECTED NOT DETECTED Final   Candida tropicalis NOT DETECTED NOT DETECTED Final  Body fluid culture     Status: None (Preliminary result)   Collection Time: 08/15/17  2:45 PM  Result Value Ref Range Status   Specimen Description PLEURAL  Final   Special Requests Normal   Final   Gram Stain   Final    ABUNDANT WBC PRESENT, PREDOMINANTLY PMN FEW GRAM POSITIVE COCCI    Culture   Final    NO GROWTH < 12 HOURS Performed at [facility], [address] [place], [place] [address]    Report Status PENDING  Incomplete  Culture, fungus without smear     Status: None (Preliminary result)   Collection Time: 08/15/17  2:45 PM  Result Value Ref Range Status   Specimen Description PLEURAL  Final   Special Requests Normal  Final   Culture   Final    NO GROWTH < 24 HOURS Performed at [facility], [place] [address], [place], [place] [address]    Report Status PENDING  Incomplete    Medical History: Past Medical History:  Diagnosis Date  . Arthritis    bad back, pinched nerve  . Asymmetrical sensorineural hearing loss 2012   Right ear (odd appearance), s/p eval by ENT and MRI, never followed up.  will recommend pt f/u.  Marland Kitchen Depression   . History of chicken pox   . Hx: UTI (urinary tract infection)   . Smoker     Medications:  Medications Prior to Admission  Medication Sig Dispense Refill Last Dose  . acetaminophen (TYLENOL) 500 MG tablet Take 1,000 mg by mouth every 6 (six) hours as needed.  30 tablet  Past Week at Unknown time   Scheduled:  . feeding supplement (ENSURE ENLIVE)  237 mL Oral TID BM  . folic acid  1 mg Oral Daily  . guaiFENesin  600 mg Oral BID  . heparin  5,000 Units Subcutaneous Q8H  . multivitamin with minerals  1 tablet Oral Daily  . nicotine  21 mg Transdermal Daily  . thiamine  100 mg Oral Daily   Or  . thiamine  100 mg Intravenous Daily   Assessment: Pharmacy to dose and monitor Unasyn in this 61 year old male being treated for empyema. Patient previously on Vancomycin and Zosyn; however ID has decided to change therapy to Unasyn monotherapy.  Goal of Therapy:    Plan:  Will start Unasyn 3 g IV q6 hours.   Lashanti Chambless D 08/16/2017,6:37 PM

## 2017-08-16 NOTE — Progress Notes (Signed)
  Patient ID: ANANT AGARD, male   DOB: 1955-09-26, 61 y.o.   MRN: [id number]  HISTORY: No real complaints today.  Pigtail draining pure pus.  Afebrile.   Vitals:   08/16/17 1139 08/16/17 1324  BP: 101/61 110/67  Pulse: 62 66  Resp:  18  Temp:  98.3 F (36.8 C)  SpO2:  98%     EXAM:    Resp: Lungs are clear on left and decerased on right.  No respiratory distress, normal effort. Heart:  Regular without murmurs Abd:  Abdomen is soft, non distended and non tender. No masses are palpable.  There is no rebound and no guarding.    ASSESSMENT: Independent review of CXRay shows improvement but still with large pleural effusion.  Gram stain positive.   PLAN:   I administered intrapleural TPA and will repeat CXRay in the morning.    Nestor Lewandowsky, MD

## 2017-08-16 NOTE — Plan of Care (Signed)
Pt is progressing. Continuing to receive IV vanc and zosyn. Pt ambulated around nurses station and tolerated well

## 2017-08-16 NOTE — Progress Notes (Signed)
[facility] [place] NAME: Adam Boyd    MR#:  [id number]  DATE OF BIRTH:  [birth year]  SUBJECTIVE:  CHIEF COMPLAINT:   Chief Complaint  Patient presents with  . Cough   The patient is alert awake and oriented, has no complaints. REVIEW OF SYSTEMS:  Review of Systems  Constitutional: Negative for chills, fever and malaise/fatigue.  HENT: Positive for hearing loss. Negative for sore throat.   Eyes: Negative for blurred vision and double vision.  Respiratory: Negative for cough, hemoptysis, shortness of breath, wheezing and stridor.   Cardiovascular: Negative for chest pain, palpitations, orthopnea and leg swelling.  Gastrointestinal: Negative for abdominal pain, blood in stool, diarrhea, melena, nausea and vomiting.  Genitourinary: Negative for dysuria, flank pain and hematuria.  Musculoskeletal: Negative for back pain and joint pain.  Neurological: Negative for dizziness, sensory change, focal weakness, seizures, loss of consciousness, weakness and headaches.  Endo/Heme/Allergies: Negative for polydipsia.  Psychiatric/Behavioral: Negative for depression. The patient is not nervous/anxious.     DRUG ALLERGIES:  No Known Allergies VITALS:  Blood pressure 110/67, pulse 66, temperature 98.3 F (36.8 C), temperature source Oral, resp. rate 18, height 5\' 7"  (1.702 m), weight 122 lb 12.8 oz (55.7 kg), SpO2 98 %. PHYSICAL EXAMINATION:  Physical Exam  Constitutional: He is oriented to person, place, and time and well-developed, well-nourished, and in no distress.  HENT:  Head: Normocephalic.  Mouth/Throat: Oropharynx is clear and moist.  Eyes: Conjunctivae and EOM are normal. Pupils are equal, round, and reactive to light. No scleral icterus.  Neck: Normal range of motion. Neck supple. No JVD present. No tracheal deviation present.  Cardiovascular: Normal rate, regular rhythm and normal heart sounds. Exam reveals no gallop.  No murmur  heard. Pulmonary/Chest: Effort normal and breath sounds normal. No respiratory distress. He has no wheezes. He has no rales.  Abdominal: Soft. Bowel sounds are normal. He exhibits no distension. There is no tenderness. There is no rebound.  Musculoskeletal: Normal range of motion. He exhibits no edema or tenderness.  Neurological: He is alert and oriented to person, place, and time. No cranial nerve deficit.  Skin: No rash noted. No erythema.  Psychiatric: Affect normal.   LABORATORY PANEL:  Male CBC Recent Labs  Lab 08/12/17 0505  WBC 9.8  HGB 11.3*  HCT 32.5*  PLT 665*   ------------------------------------------------------------------------------------------------------------------ Chemistries  Recent Labs  Lab 08/11/17 1624  08/15/17 0332 08/16/17 0341  NA 128*   < > 135  --   K 3.6   < > 3.6 3.7  CL 92*   < > 105  --   CO2 24   < > 22  --   GLUCOSE 126*   < > 119*  --   BUN 13   < > 7  --   CREATININE 0.79   < > 0.77  --   CALCIUM 8.2*   < > 8.0*  --   MG  --    < >  --  1.8  AST 113*  --   --   --   ALT 53  --   --   --   ALKPHOS 110  --   --   --   BILITOT 0.8  --   --   --    < > = values in this interval not displayed.   RADIOLOGY:  Dg Chest Port 1 View  Result Date: 08/16/2017 CLINICAL DATA:  Follow-up right-sided pleural  effusion and loculated air collection with subsequent small caliber chest tube placement. EXAM: PORTABLE CHEST 1 VIEW COMPARISON:  CT scan of the chest and chest x-ray of August 11, 2017 FINDINGS: A double pigtail catheter is in place and has its tip overlying the posteromedial aspect of the right seventh rib. The volume of pleural fluid has decreased somewhat. No definite pleural space or parenchymal gas collections are observed. There is no significant mediastinal shift. The left lung is clear. The heart and pulmonary vascularity are normal. The bony thorax exhibits no acute abnormality. IMPRESSION: Interval decrease in the volume of pleural  fluid on the right with a moderate amount remaining in layering laterally and inferiorly. No definite pleural space or intraparenchymal gas collections. The small caliber drainage catheter is in reasonable position. Electronically Signed   By: David  Martinique M.D.   On: 08/16/2017 07:23   ASSESSMENT AND PLAN:   Pt is 61 year old white male with history of smoking presenting with cough and weight loss  1. Right-sided empyema. Continue Zosyn and discontinued Levaquin, add back vanco due to positive GPC blood culture per Dr. Ola Spurr. s/p right chest tube placement with tPA infusion.  Follow-up with Dr. Genevive Bi.  2.  Hyponatremia possibly due to SIADH and or due to dehydration  Improved with normal saline IV.  3.  Nicotine abuse smoking cessation provided, on nicotine patch.  Alcohol abuse and withdrawal.  On CIWA protocol.  Withdrawal improved.  All the records are reviewed and case discussed with Care Management/Social Worker. Management plans discussed with the patient, his sisters and they are in agreement.  CODE STATUS: Full Code  TOTAL TIME TAKING CARE OF THIS PATIENT: 32 minutes.   More than 50% of the time was spent in counseling/coordination of care: YES  POSSIBLE D/C IN 2 DAYS, DEPENDING ON CLINICAL CONDITION.   Demetrios Loll M.D on 08/16/2017 at 3:25 PM  Between 7am to 6pm - Pager - [phone]  After 6pm go to [email or web address] - [facility]

## 2017-08-16 NOTE — Progress Notes (Signed)
[facility] PROGRESS NOTE Date of Admission:  08/11/2017     ID: Adam Boyd is a 61 y.o. male with  empyema Active Problems:   Empyema ([place])   Subjective: No fever, still with purulent drainage  ROS  Eleven systems are reviewed and negative except per hpi  Medications:  Antibiotics Given (last 72 hours)    Date/Time Action Medication Dose Rate   08/13/17 2011 New Bag/Given   levofloxacin (LEVAQUIN) IVPB 750 mg 750 mg 100 mL/hr   08/13/17 2236 New Bag/Given   piperacillin-tazobactam (ZOSYN) IVPB 3.375 g 3.375 g 12.5 mL/hr   08/14/17 0604 New Bag/Given   piperacillin-tazobactam (ZOSYN) IVPB 3.375 g 3.375 g 12.5 mL/hr   08/14/17 1335 New Bag/Given   piperacillin-tazobactam (ZOSYN) IVPB 3.375 g 3.375 g 12.5 mL/hr   08/14/17 1952 New Bag/Given   levofloxacin (LEVAQUIN) IVPB 750 mg 750 mg 100 mL/hr   08/14/17 2139 New Bag/Given   piperacillin-tazobactam (ZOSYN) IVPB 3.375 g 3.375 g 12.5 mL/hr   08/15/17 0532 New Bag/Given   piperacillin-tazobactam (ZOSYN) IVPB 3.375 g 3.375 g 12.5 mL/hr   08/15/17 1731 New Bag/Given   piperacillin-tazobactam (ZOSYN) IVPB 3.375 g 3.375 g 12.5 mL/hr   08/15/17 2044 New Bag/Given   vancomycin (VANCOCIN) IVPB 1000 mg/200 mL premix 1,000 mg 200 mL/hr   08/15/17 2206 New Bag/Given   piperacillin-tazobactam (ZOSYN) IVPB 3.375 g 3.375 g 12.5 mL/hr   08/16/17 0512 New Bag/Given   piperacillin-tazobactam (ZOSYN) IVPB 3.375 g 3.375 g 12.5 mL/hr   08/16/17 0826 New Bag/Given   vancomycin (VANCOCIN) IVPB 750 mg/150 ml premix 750 mg 150 mL/hr   08/16/17 1402 New Bag/Given   piperacillin-tazobactam (ZOSYN) IVPB 3.375 g 3.375 g 12.5 mL/hr     . feeding supplement (ENSURE ENLIVE)  237 mL Oral TID BM  . folic acid  1 mg Oral Daily  . guaiFENesin  600 mg Oral BID  . heparin  5,000 Units Subcutaneous Q8H  . multivitamin with minerals  1 tablet Oral Daily  . nicotine  21 mg Transdermal Daily  . thiamine  100 mg Oral Daily   Or  .  thiamine  100 mg Intravenous Daily    Objective: Vital signs in last 24 hours: Temp:  [98.1 F (36.7 C)-98.3 F (36.8 C)] 98.3 F (36.8 C) (12/06 1324) Pulse Rate:  [62-75] 66 (12/06 1324) Resp:  [18] 18 (12/06 1324) BP: (101-112)/(55-67) 110/67 (12/06 1324) SpO2:  [97 %-98 %] 98 % (12/06 1324) Constitutional:thin confused HENT: anicteric Mouth/Throat: Oropharynx is clear and moist. No oropharyngeal exudate.  Cardiovascular: Normal rate, regular rhythm and normal heart sounds. Exam reveals no gallop and no friction rub.  No murmur heard.  Pulmonary/Chest: Decreased BS on R.  CT with yellow purulent drainage  Abdominal: Soft. Bowel sounds are normal. He exhibits no distension. There is no tenderness.  Lymphadenopathy:  He has no cervical adenopathy.  Neurological: He is alert and oriented to person, place, and time.  Skin: Skin is warm and dry. No rash noted. No erythema.  Psychiatric: He has a normal mood and affect. His behavior is normal.      Lab Results Recent Labs    08/14/17 0456 08/15/17 0332 08/16/17 0341  NA 131* 135  --   K 3.7 3.6 3.7  CL 102 105  --   CO2 19* 22  --   BUN 6 7  --   CREATININE 0.74 0.77  --     Microbiology: Results for orders placed or performed  during the hospital encounter of 08/11/17  Culture, blood (routine x 2)     Status: None   Collection Time: 08/11/17  7:09 PM  Result Value Ref Range Status   Specimen Description BLOOD RIGHT FOREARM  Final   Special Requests   Final    BOTTLES DRAWN AEROBIC AND ANAEROBIC Blood Culture adequate volume   Culture NO GROWTH 5 DAYS  Final   Report Status 08/16/2017 FINAL  Final  Culture, blood (routine x 2)     Status: Abnormal   Collection Time: 08/11/17  7:36 PM  Result Value Ref Range Status   Specimen Description BLOOD RIGHT ANTECUBITAL  Final   Special Requests   Final    BOTTLES DRAWN AEROBIC AND ANAEROBIC Blood Culture results may not be optimal due to an excessive volume of blood  received in culture bottles   Culture  Setup Time   Final    GRAM POSITIVE COCCI AEROBIC BOTTLE ONLY CRITICAL RESULT CALLED TO, READ BACK BY AND VERIFIED WITH: Tristen Luce BESANTI AT 0522 08/15/17 SDR GRAM STAIN REVIEWED-AGREE WITH RESULT    Culture (A)  Final    MICROCOCCUS LUTEUS/LYLAE Standardized susceptibility testing for this organism is not available. Performed at [facility], [place] [address], [place], [place] [address]    Report Status 08/16/2017 FINAL  Final  Blood Culture ID Panel (Reflexed)     Status: None   Collection Time: 08/11/17  7:36 PM  Result Value Ref Range Status   Enterococcus species NOT DETECTED NOT DETECTED Final   Listeria monocytogenes NOT DETECTED NOT DETECTED Final   Staphylococcus species NOT DETECTED NOT DETECTED Final   Staphylococcus aureus NOT DETECTED NOT DETECTED Final   Streptococcus species NOT DETECTED NOT DETECTED Final   Streptococcus agalactiae NOT DETECTED NOT DETECTED Final   Streptococcus pneumoniae NOT DETECTED NOT DETECTED Final   Streptococcus pyogenes NOT DETECTED NOT DETECTED Final   Acinetobacter baumannii NOT DETECTED NOT DETECTED Final   Enterobacteriaceae species NOT DETECTED NOT DETECTED Final   Enterobacter cloacae complex NOT DETECTED NOT DETECTED Final   Escherichia coli NOT DETECTED NOT DETECTED Final   Klebsiella oxytoca NOT DETECTED NOT DETECTED Final   Klebsiella pneumoniae NOT DETECTED NOT DETECTED Final   Proteus species NOT DETECTED NOT DETECTED Final   Serratia marcescens NOT DETECTED NOT DETECTED Final   Haemophilus influenzae NOT DETECTED NOT DETECTED Final   Neisseria meningitidis NOT DETECTED NOT DETECTED Final   Pseudomonas aeruginosa NOT DETECTED NOT DETECTED Final   Candida albicans NOT DETECTED NOT DETECTED Final   Candida glabrata NOT DETECTED NOT DETECTED Final   Candida krusei NOT DETECTED NOT DETECTED Final   Candida parapsilosis NOT DETECTED NOT DETECTED Final   Candida tropicalis NOT DETECTED NOT  DETECTED Final  Body fluid culture     Status: None (Preliminary result)   Collection Time: 08/15/17  2:45 PM  Result Value Ref Range Status   Specimen Description PLEURAL  Final   Special Requests Normal  Final   Gram Stain   Final    ABUNDANT WBC PRESENT, PREDOMINANTLY PMN FEW GRAM POSITIVE COCCI    Culture   Final    NO GROWTH < 12 HOURS Performed at [facility], [address] [place], [place] [address]    Report Status PENDING  Incomplete  Culture, fungus without smear     Status: None (Preliminary result)   Collection Time: 08/15/17  2:45 PM  Result Value Ref Range Status   Specimen Description PLEURAL  Final   Special Requests Normal  Final   Culture   Final    NO GROWTH < 24 HOURS Performed at [facility], [place] [address], [place], [place] [address]    Report Status PENDING  Incomplete    Studies/Results: Dg Chest Port 1 View  Result Date: 08/16/2017 CLINICAL DATA:  Follow-up right-sided pleural effusion and loculated air collection with subsequent small caliber chest tube placement. EXAM: PORTABLE CHEST 1 VIEW COMPARISON:  CT scan of the chest and chest x-ray of August 11, 2017 FINDINGS: A double pigtail catheter is in place and has its tip overlying the posteromedial aspect of the right seventh rib. The volume of pleural fluid has decreased somewhat. No definite pleural space or parenchymal gas collections are observed. There is no significant mediastinal shift. The left lung is clear. The heart and pulmonary vascularity are normal. The bony thorax exhibits no acute abnormality. IMPRESSION: Interval decrease in the volume of pleural fluid on the right with a moderate amount remaining in layering laterally and inferiorly. No definite pleural space or intraparenchymal gas collections. The small caliber drainage catheter is in reasonable position. Electronically Signed   By: Marlet Korte  Martinique M.D.   On: 08/16/2017 07:23   Ct Image Guided Drainage By Percutaneous  Catheter  Result Date: 08/15/2017 INDICATION: 61 year old with a right chest empyema. EXAM: CT-GUIDED PLACEMENT OF RIGHT PLEURAL DRAIN MEDICATIONS: None ANESTHESIA/SEDATION: 2.0 mg IV Versed 75 mcg IV Fentanyl Moderate Sedation Time:  32 minutes The patient was continuously monitored during the procedure by the interventional radiology nurse under my direct supervision. COMPLICATIONS: None immediate. TECHNIQUE: Informed written consent was obtained from the patient and sister after a thorough discussion of the procedural risks, benefits and alternatives. All questions were addressed. Maximal Sterile Barrier Technique was utilized including caps, mask, sterile gowns, sterile gloves, sterile drape, hand hygiene and skin antiseptic. A timeout was performed prior to the initiation of the procedure. PROCEDURE: Patient was placed on the CT scanner with the right side mildly elevated. CT images through the chest were obtained. Right mid axillary region was prepped and draped in a sterile fashion. Skin and soft tissues were anesthetized with 1% lidocaine. 18 gauge trocar needle was directed into the right pleural space with CT guidance. Stiff Amplatz wire was advanced. The tract was dilated to accommodate a 12 [place] multipurpose drain. Drain was easily advanced over the wire and positioned within the pleural space. A large amount of thick yellow purulent fluid was aspirated. Catheter was attached to a pleural evacuation canister and wall suction. Catheter was sutured to the skin. Fluid was sent for culture. FINDINGS: Large right pleural effusion containing a large amount of gas. Thick yellow fluid was removed. Findings are compatible with an empyema. IMPRESSION: CT-guided placement of a right chest pleural drain. Thick yellow fluid was removed and findings are compatible with an empyema. Fluid was sent for culture. Electronically Signed   By: Markus Daft M.D.   On: 08/15/2017 17:34    Assessment/Plan: Adam Boyd  is a 61 y.o. male with hx ETOH and tobacco abuse admitted with cough x 3 weeks and weight loss, weakness. 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  Has been seen by Dr Genevive Bi. CT drain placed.  Micrococcus is a coag neg staph so may be contaminant.  Culture from abscess pending but likely anaerobic or mixed.  Recommendations.  Change zosyn to unasyn  Can dc vanco  WIll  need prolonged abx therapy but hopefully oral  Thank you very much for the consult. Will follow with you.  Leonel Ramsay   08/16/2017, 4:53 PM

## 2017-08-17 ENCOUNTER — Inpatient Hospital Stay: Payer: Medicaid Other

## 2017-08-17 LAB — CBC
HEMATOCRIT: 30.8 % — AB (ref 40.0–52.0)
Hemoglobin: 10.6 g/dL — ABNORMAL LOW (ref 13.0–18.0)
MCH: 33 pg (ref 26.0–34.0)
MCHC: 34.4 g/dL (ref 32.0–36.0)
MCV: 95.9 fL (ref 80.0–100.0)
PLATELETS: 697 10*3/uL — AB (ref 150–440)
RBC: 3.21 MIL/uL — AB (ref 4.40–5.90)
RDW: 13.2 % (ref 11.5–14.5)
WBC: 6.6 10*3/uL (ref 3.8–10.6)

## 2017-08-17 LAB — ABO/RH: ABO/RH(D): O NEG

## 2017-08-17 MED ORDER — SENNOSIDES-DOCUSATE SODIUM 8.6-50 MG PO TABS
2.0000 | ORAL_TABLET | Freq: Every day | ORAL | Status: DC
Start: 2017-08-17 — End: 2017-08-21
  Administered 2017-08-17 – 2017-08-20 (×4): 2 via ORAL
  Filled 2017-08-17 (×4): qty 2

## 2017-08-17 MED ORDER — SODIUM CHLORIDE 0.9 % IJ SOLN
Freq: Once | INTRAMUSCULAR | Status: DC
  Filled 2017-08-17: qty 10

## 2017-08-17 NOTE — Progress Notes (Signed)
Nutrition Follow Up Note   DOCUMENTATION CODES:   Severe malnutrition in context of chronic illness  INTERVENTION:  Ensure Enlive po TID, each supplement provides 350 kcal and 20 grams of protein  Magic cup TID with meals, each supplement provides 290 kcal and 9 grams of protein  MVI daily  Thiamine '100mg'$  daily  Folic acid '1mg'$  daily   NUTRITION DIAGNOSIS:   Severe Malnutrition related to catabolic illness(etoh abuse, lung infection ) as evidenced by increased estimated needs from protein.  GOAL:   Patient will meet greater than or equal to 90% of their needs  MONITOR:   PO intake, Supplement acceptance, Labs, Weight trends, I & O's  ASSESSMENT:   61 year old white male with history of etoh abuse and smoking presenting with cough and weight loss. Pt found to have Empyema as well as other underlying lung infection   Pt s/p chest tube placement 12/5  Met with pt in room today. Pt more alert today. Pt reports good appetite and oral intake pta; however, pt' sister at bedside reports that pt does not eat a lot at baseline. Pt reports a 45lb(27%) wt loss in one year; this is severe. There is no recent wt history in chart to confirm weight loss. Pt's sister confirms that pt has lost a significant amount of weight. Pt is currently eating 100% of meals in hospital and drinking Ensure. Pt getting MVI, thiamine, and folic acid r/t etoh abuse. RD educated pt on the importance of adequate protein intake and recommended daily supplements and MVI at home.   Medications reviewed and include: folic acid, heparin, MVI, nicotine, thiamine, unasyn   Labs reviewed: K 3.7 wnl, P 3.7 wnl, Mg 1.8 wnl- 12/6 Hgb 10.6(L), Hct 30.8(L)  Nutrition-Focused physical exam completed. Findings are severe fat and muscle depletions over entire body, and no edema.   Diet Order:  Diet regular Room service appropriate? Yes; Fluid consistency: Thin  EDUCATION NEEDS:   Education needs have been  addressed  Skin:  Reviewed RN Assessment  Last BM:  12/7- type 4  Height:   Ht Readings from Last 1 Encounters:  08/11/17 '5\' 7"'$  (1.702 m)    Weight:   Wt Readings from Last 1 Encounters:  08/11/17 122 lb 12.8 oz (55.7 kg)    Ideal Body Weight:  67.27 kg  BMI:  Body mass index is 19.23 kg/m.  Estimated Nutritional Needs:   Kcal:  1700-2000kcal/day   Protein:  83-94g/day   Fluid:  >1.7L/day   Koleen Distance MS, RD, LDN Pager #[phone] After Hours Pager: [phone]

## 2017-08-17 NOTE — Progress Notes (Signed)
[facility] INFECTIOUS [facility] PROGRESS NOTE Date of Admission:  08/11/2017     ID: Adam Boyd is a 61 y.o. male with  empyema Active Problems:   Empyema ([place])   Subjective: No fever, still with purulent drainage. No new complaints.   ROS  Eleven systems are reviewed and negative except per hpi  Medications:  Antibiotics Given (last 72 hours)    Date/Time Action Medication Dose Rate   08/14/17 1952 New Bag/Given   levofloxacin (LEVAQUIN) IVPB 750 mg 750 mg 100 mL/hr   08/14/17 2139 New Bag/Given   piperacillin-tazobactam (ZOSYN) IVPB 3.375 g 3.375 g 12.5 mL/hr   08/15/17 0532 New Bag/Given   piperacillin-tazobactam (ZOSYN) IVPB 3.375 g 3.375 g 12.5 mL/hr   08/15/17 1731 New Bag/Given   piperacillin-tazobactam (ZOSYN) IVPB 3.375 g 3.375 g 12.5 mL/hr   08/15/17 2044 New Bag/Given   vancomycin (VANCOCIN) IVPB 1000 mg/200 mL premix 1,000 mg 200 mL/hr   08/15/17 2206 New Bag/Given   piperacillin-tazobactam (ZOSYN) IVPB 3.375 g 3.375 g 12.5 mL/hr   08/16/17 4481 New Bag/Given   piperacillin-tazobactam (ZOSYN) IVPB 3.375 g 3.375 g 12.5 mL/hr   08/16/17 0826 New Bag/Given   vancomycin (VANCOCIN) IVPB 750 mg/150 ml premix 750 mg 150 mL/hr   08/16/17 1402 New Bag/Given   piperacillin-tazobactam (ZOSYN) IVPB 3.375 g 3.375 g 12.5 mL/hr   08/16/17 2314 New Bag/Given   Ampicillin-Sulbactam (UNASYN) 3 g in sodium chloride 0.9 % 100 mL IVPB 3 g 200 mL/hr   08/17/17 0529 New Bag/Given   Ampicillin-Sulbactam (UNASYN) 3 g in sodium chloride 0.9 % 100 mL IVPB 3 g 200 mL/hr   08/17/17 1201 New Bag/Given   Ampicillin-Sulbactam (UNASYN) 3 g in sodium chloride 0.9 % 100 mL IVPB 3 g 200 mL/hr     . alteplase (tPA) 42m in NS 437mfor Dr.Oaks (intrapleural administration/[facility])   Intrapleural Once  . feeding supplement (ENSURE ENLIVE)  237 mL Oral TID BM  . folic acid  1 mg Oral Daily  . guaiFENesin  600 mg Oral BID  . heparin  5,000 Units Subcutaneous Q8H  . multivitamin with minerals   1 tablet Oral Daily  . nicotine  21 mg Transdermal Daily  . senna-docusate  2 tablet Oral QHS  . thiamine  100 mg Oral Daily   Or  . thiamine  100 mg Intravenous Daily    Objective: Vital signs in last 24 hours: Temp:  [97.8 F (36.6 C)-98.3 F (36.8 C)] 98.3 F (36.8 C) (12/07 1300) Pulse Rate:  [59-71] 69 (12/07 1300) Resp:  [16] 16 (12/07 1300) BP: (99-112)/(53-63) 99/53 (12/07 1300) SpO2:  [96 %-98 %] 96 % (12/07 1300) Weight:  [58.7 kg (129 lb 4.8 oz)] 58.7 kg (129 lb 4.8 oz) (12/07 1300) Constitutional:thin confused HENT: anicteric Mouth/Throat: Oropharynx is clear and moist. No oropharyngeal exudate.  Cardiovascular: Normal rate, regular rhythm and normal heart sounds. Exam reveals no gallop and no friction rub.  No murmur heard.  Pulmonary/Chest: Decreased BS on R.  CT with yellow purulent drainage  Abdominal: Soft. Bowel sounds are normal. He exhibits no distension. There is no tenderness.  Lymphadenopathy:  He has no cervical adenopathy.  Neurological: He is alert and oriented to person, place, and time.  Skin: Skin is warm and dry. No rash noted. No erythema.  Psychiatric: He has a normal mood and affect. His behavior is normal.      Lab Results Recent Labs    08/15/17 0332 08/16/17 0341 08/17/17 0413  WBC  --   --  6.6  HGB  --   --  10.6*  HCT  --   --  30.8*  NA 135  --   --   K 3.6 3.7  --   CL 105  --   --   CO2 22  --   --   BUN 7  --   --   CREATININE 0.77  --   --     Microbiology: Results for orders placed or performed during the hospital encounter of 08/11/17  Culture, blood (routine x 2)     Status: None   Collection Time: 08/11/17  7:09 PM  Result Value Ref Range Status   Specimen Description BLOOD RIGHT FOREARM  Final   Special Requests   Final    BOTTLES DRAWN AEROBIC AND ANAEROBIC Blood Culture adequate volume   Culture NO GROWTH 5 DAYS  Final   Report Status 08/16/2017 FINAL  Final  Culture, blood (routine x 2)     Status:  Abnormal   Collection Time: 08/11/17  7:36 PM  Result Value Ref Range Status   Specimen Description BLOOD RIGHT ANTECUBITAL  Final   Special Requests   Final    BOTTLES DRAWN AEROBIC AND ANAEROBIC Blood Culture results may not be optimal due to an excessive volume of blood received in culture bottles   Culture  Setup Time   Final    GRAM POSITIVE COCCI AEROBIC BOTTLE ONLY CRITICAL RESULT CALLED TO, READ BACK BY AND VERIFIED WITH: Willys Salvino BESANTI AT 0522 08/15/17 SDR GRAM STAIN REVIEWED-AGREE WITH RESULT    Culture (A)  Final    MICROCOCCUS LUTEUS/LYLAE Standardized susceptibility testing for this organism is not available. Performed at [facility], [place] [address], [place], [place] [address]    Report Status 08/16/2017 FINAL  Final  Blood Culture ID Panel (Reflexed)     Status: None   Collection Time: 08/11/17  7:36 PM  Result Value Ref Range Status   Enterococcus species NOT DETECTED NOT DETECTED Final   Listeria monocytogenes NOT DETECTED NOT DETECTED Final   Staphylococcus species NOT DETECTED NOT DETECTED Final   Staphylococcus aureus NOT DETECTED NOT DETECTED Final   Streptococcus species NOT DETECTED NOT DETECTED Final   Streptococcus agalactiae NOT DETECTED NOT DETECTED Final   Streptococcus pneumoniae NOT DETECTED NOT DETECTED Final   Streptococcus pyogenes NOT DETECTED NOT DETECTED Final   Acinetobacter baumannii NOT DETECTED NOT DETECTED Final   Enterobacteriaceae species NOT DETECTED NOT DETECTED Final   Enterobacter cloacae complex NOT DETECTED NOT DETECTED Final   Escherichia coli NOT DETECTED NOT DETECTED Final   Klebsiella oxytoca NOT DETECTED NOT DETECTED Final   Klebsiella pneumoniae NOT DETECTED NOT DETECTED Final   Proteus species NOT DETECTED NOT DETECTED Final   Serratia marcescens NOT DETECTED NOT DETECTED Final   Haemophilus influenzae NOT DETECTED NOT DETECTED Final   Neisseria meningitidis NOT DETECTED NOT DETECTED Final   Pseudomonas aeruginosa NOT  DETECTED NOT DETECTED Final   Candida albicans NOT DETECTED NOT DETECTED Final   Candida glabrata NOT DETECTED NOT DETECTED Final   Candida krusei NOT DETECTED NOT DETECTED Final   Candida parapsilosis NOT DETECTED NOT DETECTED Final   Candida tropicalis NOT DETECTED NOT DETECTED Final  Body fluid culture     Status: None (Preliminary result)   Collection Time: 08/15/17  2:45 PM  Result Value Ref Range Status   Specimen Description PLEURAL  Final   Special Requests Normal  Final   Gram Stain   Final  ABUNDANT WBC PRESENT, PREDOMINANTLY PMN FEW GRAM POSITIVE COCCI    Culture   Final    ABUNDANT UNIDENTIFIED ORGANISM Performed at [facility], [place] [address], [place], [place] [address]    Report Status PENDING  Incomplete  Acid Fast Smear (AFB)     Status: None   Collection Time: 08/15/17  2:45 PM  Result Value Ref Range Status   AFB Specimen Processing Concentration  Final   Acid Fast Smear Negative  Final    Comment: (NOTE) Performed At: [facility] [address] [place], [place] [id number] Rush Farmer MD [id number]    Source (AFB) PLEURAL  Final  Culture, fungus without smear     Status: None (Preliminary result)   Collection Time: 08/15/17  2:45 PM  Result Value Ref Range Status   Specimen Description PLEURAL  Final   Special Requests Normal  Final   Culture   Final    NO FUNGUS ISOLATED AFTER 2 DAYS Performed at [facility], [address] [place], [place] [address]    Report Status PENDING  Incomplete    Studies/Results: Dg Chest 2 View  Result Date: 08/17/2017 CLINICAL DATA:  61 year old male status post chest tube placement for empyema. EXAM: CHEST  2 VIEW COMPARISON:  08/16/2017 and earlier. FINDINGS: Seated AP and lateral views of the chest. Pigtail type right lateral approach chest tube remains in place and courses to the posterior right pleural space at the mid lung level. Significant drainage of air in fluid containing empyema  since 08/11/2017. Mild to moderate residual lateral and basilar pleural thickening or fluid. Patchy right lung base opacity. No pneumothorax. Mediastinal contours remain normal. Stable left lung with reticular opacity at at the lung base along the diaphragm correlating to left lower lobe bronchiectasis and peribronchial thickening and a tree in bud opacity on the recent CT. No acute osseous abnormality identified. Negative visible bowel gas pattern. IMPRESSION: 1. Stable right chest tube with substantial drainage of the right side empyema since 08/11/2017. No pneumothorax. Up to moderate residual right lateral and basilar pleural thickening and/or fluid. Patchy residual right lower lung opacity. 2. Stable left lung base with Bronchiectasis and features of atypical infection demonstrated in the left lower lobe on recent chest CT. 3. No new cardiopulmonary abnormality. Electronically Signed   By: Genevie Ann M.D.   On: 08/17/2017 09:17   Dg Chest Port 1 View  Result Date: 08/16/2017 CLINICAL DATA:  Follow-up right-sided pleural effusion and loculated air collection with subsequent small caliber chest tube placement. EXAM: PORTABLE CHEST 1 VIEW COMPARISON:  CT scan of the chest and chest x-ray of August 11, 2017 FINDINGS: A double pigtail catheter is in place and has its tip overlying the posteromedial aspect of the right seventh rib. The volume of pleural fluid has decreased somewhat. No definite pleural space or parenchymal gas collections are observed. There is no significant mediastinal shift. The left lung is clear. The heart and pulmonary vascularity are normal. The bony thorax exhibits no acute abnormality. IMPRESSION: Interval decrease in the volume of pleural fluid on the right with a moderate amount remaining in layering laterally and inferiorly. No definite pleural space or intraparenchymal gas collections. The small caliber drainage catheter is in reasonable position. Electronically Signed   By: Zaylynn Rickett   [place] M.D.   On: 08/16/2017 07:23   Ct Image Guided Drainage By Percutaneous Catheter  Result Date: 08/15/2017 INDICATION: 61 year old with a right chest empyema. EXAM: CT-GUIDED PLACEMENT OF RIGHT PLEURAL  DRAIN MEDICATIONS: None ANESTHESIA/SEDATION: 2.0 mg IV Versed 75 mcg IV Fentanyl Moderate Sedation Time:  32 minutes The patient was continuously monitored during the procedure by the interventional radiology nurse under my direct supervision. COMPLICATIONS: None immediate. TECHNIQUE: Informed written consent was obtained from the patient and sister after a thorough discussion of the procedural risks, benefits and alternatives. All questions were addressed. Maximal Sterile Barrier Technique was utilized including caps, mask, sterile gowns, sterile gloves, sterile drape, hand hygiene and skin antiseptic. A timeout was performed prior to the initiation of the procedure. PROCEDURE: Patient was placed on the CT scanner with the right side mildly elevated. CT images through the chest were obtained. Right mid axillary region was prepped and draped in a sterile fashion. Skin and soft tissues were anesthetized with 1% lidocaine. 18 gauge trocar needle was directed into the right pleural space with CT guidance. Stiff Amplatz wire was advanced. The tract was dilated to accommodate a 12 [place] multipurpose drain. Drain was easily advanced over the wire and positioned within the pleural space. A large amount of thick yellow purulent fluid was aspirated. Catheter was attached to a pleural evacuation canister and wall suction. Catheter was sutured to the skin. Fluid was sent for culture. FINDINGS: Large right pleural effusion containing a large amount of gas. Thick yellow fluid was removed. Findings are compatible with an empyema. IMPRESSION: CT-guided placement of a right chest pleural drain. Thick yellow fluid was removed and findings are compatible with an empyema. Fluid was sent for culture. Electronically Signed   By:  Markus Daft M.D.   On: 08/15/2017 17:34    Assessment/Plan: Adam Boyd is a 61 y.o. male with hx ETOH and tobacco abuse admitted with cough x 3 weeks and weight loss, weakness. 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  He is HIV negative.  Has been seen by Dr Genevive Bi. CT drain placed.  Cx is growing unidentified organism.     Recommendations.  Cont unasyn  WIll need prolonged abx therapy but hopefully oral Thank you very much for the consult. Will follow with you.  Leonel Ramsay   08/17/2017, 3:05 PM

## 2017-08-17 NOTE — Progress Notes (Addendum)
[facility] [place] NAME: Adam Boyd    MR#:  [id number]  DATE OF BIRTH:  April 08, 1956  SUBJECTIVE:  CHIEF COMPLAINT:   Chief Complaint  Patient presents with  . Cough   The patient has no complaints.  Chest tube drained yellowish fluid total 1400 ml. REVIEW OF SYSTEMS:  Review of Systems  Constitutional: Negative for chills, fever and malaise/fatigue.  HENT: Positive for hearing loss. Negative for sore throat.   Eyes: Negative for blurred vision and double vision.  Respiratory: Negative for cough, hemoptysis, shortness of breath, wheezing and stridor.   Cardiovascular: Negative for chest pain, palpitations, orthopnea and leg swelling.  Gastrointestinal: Negative for abdominal pain, blood in stool, diarrhea, melena, nausea and vomiting.  Genitourinary: Negative for dysuria, flank pain and hematuria.  Musculoskeletal: Negative for back pain and joint pain.  Neurological: Negative for dizziness, sensory change, focal weakness, seizures, loss of consciousness, weakness and headaches.  Endo/Heme/Allergies: Negative for polydipsia.  Psychiatric/Behavioral: Negative for depression. The patient is not nervous/anxious.     DRUG ALLERGIES:  No Known Allergies VITALS:  Blood pressure (!) 99/53, pulse 69, temperature 98.3 F (36.8 C), temperature source Oral, resp. rate 16, height 5\' 7"  (1.702 m), weight 122 lb 12.8 oz (55.7 kg), SpO2 96 %. PHYSICAL EXAMINATION:  Physical Exam  Constitutional: He is oriented to person, place, and time.  Malnutrition  HENT:  Head: Normocephalic.  Mouth/Throat: Oropharynx is clear and moist.  Eyes: Conjunctivae and EOM are normal. Pupils are equal, round, and reactive to light. No scleral icterus.  Neck: Normal range of motion. Neck supple. No JVD present. No tracheal deviation present.  Cardiovascular: Normal rate, regular rhythm and normal heart sounds. Exam reveals no gallop.  No murmur  heard. Pulmonary/Chest: Effort normal and breath sounds normal. No respiratory distress. He has no wheezes. He has no rales.  Abdominal: Soft. Bowel sounds are normal. He exhibits no distension. There is no tenderness. There is no rebound.  Musculoskeletal: Normal range of motion. He exhibits no edema or tenderness.  Neurological: He is alert and oriented to person, place, and time. No cranial nerve deficit.  Skin: No rash noted. No erythema.  Psychiatric: Affect normal.   LABORATORY PANEL:  Male CBC Recent Labs  Lab 08/17/17 0413  WBC 6.6  HGB 10.6*  HCT 30.8*  PLT 697*   ------------------------------------------------------------------------------------------------------------------ Chemistries  Recent Labs  Lab 08/11/17 1624  08/15/17 0332 08/16/17 0341  NA 128*   < > 135  --   K 3.6   < > 3.6 3.7  CL 92*   < > 105  --   CO2 24   < > 22  --   GLUCOSE 126*   < > 119*  --   BUN 13   < > 7  --   CREATININE 0.79   < > 0.77  --   CALCIUM 8.2*   < > 8.0*  --   MG  --    < >  --  1.8  AST 113*  --   --   --   ALT 53  --   --   --   ALKPHOS 110  --   --   --   BILITOT 0.8  --   --   --    < > = values in this interval not displayed.   RADIOLOGY:  Dg Chest 2 View  Result Date: 08/17/2017 CLINICAL DATA:  61 year old male status post  chest tube placement for empyema. EXAM: CHEST  2 VIEW COMPARISON:  08/16/2017 and earlier. FINDINGS: Seated AP and lateral views of the chest. Pigtail type right lateral approach chest tube remains in place and courses to the posterior right pleural space at the mid lung level. Significant drainage of air in fluid containing empyema since 08/11/2017. Mild to moderate residual lateral and basilar pleural thickening or fluid. Patchy right lung base opacity. No pneumothorax. Mediastinal contours remain normal. Stable left lung with reticular opacity at at the lung base along the diaphragm correlating to left lower lobe bronchiectasis and peribronchial  thickening and a tree in bud opacity on the recent CT. No acute osseous abnormality identified. Negative visible bowel gas pattern. IMPRESSION: 1. Stable right chest tube with substantial drainage of the right side empyema since 08/11/2017. No pneumothorax. Up to moderate residual right lateral and basilar pleural thickening and/or fluid. Patchy residual right lower lung opacity. 2. Stable left lung base with Bronchiectasis and features of atypical infection demonstrated in the left lower lobe on recent chest CT. 3. No new cardiopulmonary abnormality. Electronically Signed   By: Genevie Ann M.D.   On: 08/17/2017 09:17   ASSESSMENT AND PLAN:   Pt is 61 year old white male with history of smoking presenting with cough and weight loss  1. Right-sided empyema. Discontinue Zosyned and vanco, started Unasyn, wIll need prolonged abx therapy per Dr. Ola Spurr. s/p right chest tube placement with tPA infusion.  Surgery in 2 days per Dr. Genevive Bi.  2.  Hyponatremia possibly due to SIADH and or due to dehydration  Improved with normal saline IV.  3.  Nicotine abuse smoking cessation provided, on nicotine patch.  Alcohol abuse and withdrawal.  On CIWA protocol.  Withdrawal improved.  Severe malnutrition.  Dietitian consult.  All the records are reviewed and case discussed with Care Management/Social Worker. Management plans discussed with the patient, his sisters and they are in agreement.  CODE STATUS: Full Code  TOTAL TIME TAKING CARE OF THIS PATIENT: 32 minutes.   More than 50% of the time was spent in counseling/coordination of care: YES  POSSIBLE D/C IN >3 DAYS, DEPENDING ON CLINICAL CONDITION.   Demetrios Loll M.D on 08/17/2017 at 2:14 PM  Between 7am to 6pm - Pager - [phone]  After 6pm go to [email or web address] - [facility]

## 2017-08-17 NOTE — Progress Notes (Signed)
  Patient ID: Adam Boyd, male   DOB: [birth year], 61 y.o.   MRN: [id number]  HISTORY: He states he feels well. Not short of breath.    Vitals:   08/17/17 0404 08/17/17 1300  BP: (!) 112/59 (!) 99/53  Pulse: (!) 59 69  Resp:  16  Temp: 97.8 F (36.6 C) 98.3 F (36.8 C)  SpO2: 98% 96%     EXAM:    Resp: Lungs are clear on the left but decreased on the right.  No respiratory distress, normal effort. Heart:  Regular without murmurs Abd:  Abdomen is soft, non distended and non tender. No masses are palpable.  There is no rebound and no guarding.  Skin: Skin is warm and dry. No rash noted. No diaphoretic. No erythema. No pallor.     ASSESSMENT: Right empyema.  Status post 2 cycles of TPA with continued purulent drainage   PLAN:   Reviewed the indications and risks of right thoracotomy and decortication as well as possible blood transfusion.  He would like to proceed.  Will check CXRay today and make final decision regarding surgery.    Nestor Lewandowsky, MD

## 2017-08-17 NOTE — Progress Notes (Signed)
Patient given TPA this shift by Dr. Genevive Bi; order to clamp patient tube and unclamp at 12 midnight; patient verbally responsive and denies pain at this time; meds as ordered; Will continue to monitor.

## 2017-08-17 NOTE — Care Management (Signed)
Per MD patient planned for OR on Monday.  RNCM following for charity RN and potential IV antibiotics.  Per ID patient will hopefully be able to discharge on oral antibiotics.  Jermaine with [facility] is aware of the potential Charity need and has assessed patient.

## 2017-08-18 ENCOUNTER — Inpatient Hospital Stay: Payer: Medicaid Other

## 2017-08-18 MED ORDER — LORAZEPAM 2 MG/ML IJ SOLN: 1.0000 mg | Freq: Four times a day (QID) | INTRAMUSCULAR | Status: DC | PRN

## 2017-08-18 MED ORDER — LORAZEPAM 1 MG PO TABS: 1.0000 mg | ORAL_TABLET | Freq: Four times a day (QID) | ORAL | Status: DC | PRN

## 2017-08-18 NOTE — Progress Notes (Addendum)
[place] at [place] NAME: Adam Boyd    MR#:  [id number]  DATE OF BIRTH:  [birth year]  SUBJECTIVE:  CHIEF COMPLAINT:   Chief Complaint  Patient presents with  . Cough   The patient has no complaints.  Chest tube drained yellowish fluid. REVIEW OF SYSTEMS:  Review of Systems  Constitutional: Negative for chills, fever and malaise/fatigue.  HENT: Positive for hearing loss. Negative for sore throat.   Eyes: Negative for blurred vision and double vision.  Respiratory: Negative for cough, hemoptysis, shortness of breath, wheezing and stridor.   Cardiovascular: Negative for chest pain, palpitations, orthopnea and leg swelling.  Gastrointestinal: Negative for abdominal pain, blood in stool, diarrhea, melena, nausea and vomiting.  Genitourinary: Negative for dysuria, flank pain and hematuria.  Musculoskeletal: Negative for back pain and joint pain.  Neurological: Negative for dizziness, sensory change, focal weakness, seizures, loss of consciousness, weakness and headaches.  Endo/Heme/Allergies: Negative for polydipsia.  Psychiatric/Behavioral: Negative for depression. The patient is not nervous/anxious.     DRUG ALLERGIES:  No Known Allergies VITALS:  Blood pressure 116/66, pulse 68, temperature 97.7 F (36.5 C), temperature source Oral, resp. rate 17, height 5\' 7"  (1.702 m), weight 129 lb 4.8 oz (58.7 kg), SpO2 98 %. PHYSICAL EXAMINATION:  Physical Exam  Constitutional: He is oriented to person, place, and time.  Malnutrition  HENT:  Head: Normocephalic.  Mouth/Throat: Oropharynx is clear and moist.  Eyes: Conjunctivae and EOM are normal. Pupils are equal, round, and reactive to light. No scleral icterus.  Neck: Normal range of motion. Neck supple. No JVD present. No tracheal deviation present.  Cardiovascular: Normal rate, regular rhythm and normal heart sounds. Exam reveals no gallop.  No murmur heard. Pulmonary/Chest: Effort  normal and breath sounds normal. No respiratory distress. He has no wheezes. He has no rales.  Abdominal: Soft. Bowel sounds are normal. He exhibits no distension. There is no tenderness. There is no rebound.  Musculoskeletal: Normal range of motion. He exhibits no edema or tenderness.  Neurological: He is alert and oriented to person, place, and time. No cranial nerve deficit.  Skin: No rash noted. No erythema.  Psychiatric: Affect normal.   LABORATORY PANEL:  Male CBC Recent Labs  Lab 08/17/17 0413  WBC 6.6  HGB 10.6*  HCT 30.8*  PLT 697*   ------------------------------------------------------------------------------------------------------------------ Chemistries  Recent Labs  Lab 08/11/17 1624  08/15/17 0332 08/16/17 0341  NA 128*   < > 135  --   K 3.6   < > 3.6 3.7  CL 92*   < > 105  --   CO2 24   < > 22  --   GLUCOSE 126*   < > 119*  --   BUN 13   < > 7  --   CREATININE 0.79   < > 0.77  --   CALCIUM 8.2*   < > 8.0*  --   MG  --    < >  --  1.8  AST 113*  --   --   --   ALT 53  --   --   --   ALKPHOS 110  --   --   --   BILITOT 0.8  --   --   --    < > = values in this interval not displayed.   RADIOLOGY:  Dg Chest 2 View  Result Date: 08/18/2017 CLINICAL DATA:  Patient status post placement of a pleural drainage  catheter 08/15/2017 for a complex right pleural effusion. EXAM: CHEST  2 VIEW COMPARISON:  PA and lateral chest 08/17/2017. Single-view of the chest 08/16/2017. FINDINGS: Pigtail drainage catheter in the right chest has backed out with the loop now within the chest at the lower right chest wall. No pneumothorax is identified. Small right pleural effusion versus pleural thickening and streaky airspace opacity in the right lung base persist. Minimal atelectasis in the left base is noted. Heart size is normal. IMPRESSION: Pigtail catheter in the right chest has backed out with the loop now in the chest at lower right chest wall. No pneumothorax. No change in a  small right pleural effusion versus pleural thickening and streaky right basilar airspace disease which could be atelectasis or pneumonia. Electronically Signed   By: Inge Rise M.D.   On: 08/18/2017 09:22   ASSESSMENT AND PLAN:   Pt is 61 year old white male with history of smoking presenting with cough and weight loss  1. Right-sided empyema. Discontinue Zosyned and vanco, started Unasyn, wIll need prolonged abx therapy and PICC line per Dr. Ola Spurr. s/p right chest tube placement with tPA infusion.  Surgery in 2 days per Dr. Genevive Bi. CT chest today much improved lung expansion and mild pleural effusion.  2.  Hyponatremia possibly due to SIADH and or due to dehydration  Improved with normal saline IV.  3.  Nicotine abuse smoking cessation provided, on nicotine patch.  Alcohol abuse and withdrawal.  On CIWA protocol.  Withdrawal improved.  Severe malnutrition.  Follow dietitian's recommendation.  All the records are reviewed and case discussed with Care Management/Social Worker. Management plans discussed with the patient, his sisters and they are in agreement.  CODE STATUS: Full Code  TOTAL TIME TAKING CARE OF THIS PATIENT: 27 minutes.   More than 50% of the time was spent in counseling/coordination of care: YES  POSSIBLE D/C IN 3-4 DAYS, DEPENDING ON CLINICAL CONDITION.   Demetrios Loll M.D on 08/18/2017 at 12:49 PM  Between 7am to 6pm - Pager - [phone]  After 6pm go to [email or web address] - [facility]

## 2017-08-18 NOTE — Progress Notes (Signed)
Chest tube unclamped and hooked back up to suctioned as ordered per Dr. Genevive Bi; Will continue to monitor

## 2017-08-19 LAB — BODY FLUID CULTURE: Special Requests: NORMAL

## 2017-08-19 LAB — MRSA PCR SCREENING: MRSA by PCR: NEGATIVE

## 2017-08-19 NOTE — Progress Notes (Signed)
ID E note No fevers. Cx with viridans strep. CT reviewed as well as Dr Genevive Bi note.   Rec Continue unasyn while inpatient but at dc can go home on Augmentin 875 bid for prolonged course of 4-6 weeks.

## 2017-08-19 NOTE — Progress Notes (Signed)
I have reviewed his CT scan from yesterday.  His pleural effusion is almost completely gone and there is still some pleural thickening.  His oxygen saturations are 99% on room air and he remains afebrile.  Therefore, I do not think he requires thoracotomy at this time.  I will place his surgery on hold for now.  He still requires IV antibiotics and his chest tube.  I will ask our IR staff to "upsize" his chest tube if possible.  If the tube can be upsized to a larger drain, we may be able to manage this as an outpatient.  [employer].

## 2017-08-19 NOTE — Progress Notes (Signed)
Verbal order from Dr. Genevive Bi to discontinue heparin for possible surgery this coming week and to discontinue NPO order for tonight because the patient is not going to go to surgery tomorrow per Dr. Genevive Bi. Dr. Genevive Bi stated he will talk to the POA to keep her informed for the new update of pt not having the surgery tomorrow.   Adam Boyd [employer]

## 2017-08-19 NOTE — Progress Notes (Signed)
[place] at [place] NAME: Adam Boyd    MR#:  [id number]  DATE OF BIRTH:  1956-06-08  SUBJECTIVE:  CHIEF COMPLAINT:   Chief Complaint  Patient presents with  . Cough   The patient has no complaints.  Chest tube drained yellowish fluid. REVIEW OF SYSTEMS:  Review of Systems  Constitutional: Negative for chills, fever and malaise/fatigue.  HENT: Positive for hearing loss. Negative for sore throat.   Eyes: Negative for blurred vision and double vision.  Respiratory: Negative for cough, hemoptysis, shortness of breath, wheezing and stridor.   Cardiovascular: Negative for chest pain, palpitations, orthopnea and leg swelling.  Gastrointestinal: Negative for abdominal pain, blood in stool, diarrhea, melena, nausea and vomiting.  Genitourinary: Negative for dysuria, flank pain and hematuria.  Musculoskeletal: Negative for back pain and joint pain.  Neurological: Negative for dizziness, sensory change, focal weakness, seizures, loss of consciousness, weakness and headaches.  Endo/Heme/Allergies: Negative for polydipsia.  Psychiatric/Behavioral: Negative for depression. The patient is not nervous/anxious.     DRUG ALLERGIES:  No Known Allergies VITALS:  Blood pressure 106/63, pulse 80, temperature 97.9 F (36.6 C), temperature source Oral, resp. rate (!) 21, height 5\' 7"  (1.702 m), weight 129 lb 4.8 oz (58.7 kg), SpO2 94 %. PHYSICAL EXAMINATION:  Physical Exam  Constitutional: He is oriented to person, place, and time.  Malnutrition  HENT:  Head: Normocephalic.  Mouth/Throat: Oropharynx is clear and moist.  Eyes: Conjunctivae and EOM are normal. Pupils are equal, round, and reactive to light. No scleral icterus.  Neck: Normal range of motion. Neck supple. No JVD present. No tracheal deviation present.  Cardiovascular: Normal rate, regular rhythm and normal heart sounds. Exam reveals no gallop.  No murmur heard. Pulmonary/Chest:  Effort normal and breath sounds normal. No respiratory distress. He has no wheezes. He has no rales.  Abdominal: Soft. Bowel sounds are normal. He exhibits no distension. There is no tenderness. There is no rebound.  Musculoskeletal: Normal range of motion. He exhibits no edema or tenderness.  Neurological: He is alert and oriented to person, place, and time. No cranial nerve deficit.  Skin: No rash noted. No erythema.  Psychiatric: Affect normal.   LABORATORY PANEL:  Male CBC Recent Labs  Lab 08/17/17 0413  WBC 6.6  HGB 10.6*  HCT 30.8*  PLT 697*   ------------------------------------------------------------------------------------------------------------------ Chemistries  Recent Labs  Lab 08/15/17 0332 08/16/17 0341  NA 135  --   K 3.6 3.7  CL 105  --   CO2 22  --   GLUCOSE 119*  --   BUN 7  --   CREATININE 0.77  --   CALCIUM 8.0*  --   MG  --  1.8   RADIOLOGY:  No results found. ASSESSMENT AND PLAN:   Pt is 61 year old white male with history of smoking presenting with cough and weight loss  1. Right-sided empyema. Discontinue Zosyned and vanco, started Unasyn, wIll need prolonged abx therapy and PICC line per Dr. Ola Spurr. s/p right chest tube placement with tPA infusion. CT chest showed much improved lung expansion and mild pleural effusion. No need for surgery due to much improved pleural effusion, though still some pleural thickening per Dr. Genevive Bi.  2.  Hyponatremia possibly due to SIADH and or due to dehydration. Improved with normal saline IV.  3.  Nicotine abuse smoking cessation provided, on nicotine patch.  Alcohol abuse and withdrawal.  On CIWA protocol.  Withdrawal improved.  Severe malnutrition.  Follow  dietitian's recommendation.  All the records are reviewed and case discussed with Care Management/Social Worker. Management plans discussed with the patient, his sisters and they are in agreement.  CODE STATUS: Full Code  TOTAL TIME  TAKING CARE OF THIS PATIENT: 27 minutes.   More than 50% of the time was spent in counseling/coordination of care: YES  POSSIBLE D/C IN 2 DAYS, DEPENDING ON CLINICAL CONDITION.   Demetrios Loll M.D on 08/19/2017 at 1:43 PM  Between 7am to 6pm - Pager - [phone]  After 6pm go to [email or web address] - [facility]

## 2017-08-20 ENCOUNTER — Encounter
Admission: EM | Disposition: A | Discharge status: Home / Self Care | Payer: Self-pay | Source: Home / Self Care | Attending: Internal Medicine

## 2017-08-20 LAB — TYPE AND SCREEN
ABO/RH(D): O NEG
ANTIBODY SCREEN: NEGATIVE
UNIT DIVISION: 0
Unit division: 0

## 2017-08-20 LAB — BPAM RBC
Blood Product Expiration Date: 201812202359
Blood Product Expiration Date: 201812202359
UNIT TYPE AND RH: 9500
UNIT TYPE AND RH: 9500

## 2017-08-20 LAB — PREPARE RBC (CROSSMATCH)

## 2017-08-20 SURGERY — VIDEO BRONCHOSCOPY WITHOUT FLUORO
Anesthesia: Choice | Laterality: Right

## 2017-08-20 MED ORDER — SODIUM CHLORIDE 0.9 % IJ SOLN
Freq: Once | INTRAMUSCULAR | Status: AC
  Administered 2017-08-20: 11:00:00 via INTRAPLEURAL
  Filled 2017-08-20: qty 10

## 2017-08-20 NOTE — Progress Notes (Signed)
Nutrition Follow Up Note   DOCUMENTATION CODES:   Severe malnutrition in context of chronic illness  INTERVENTION:  Ensure Enlive po TID, each supplement provides 350 kcal and 20 grams of protein  Magic cup TID with meals, each supplement provides 290 kcal and 9 grams of protein  MVI daily  Thiamine 100mg  daily  Folic acid 1mg  daily   Bowel regimen as needed   NUTRITION DIAGNOSIS:   Severe Malnutrition related to catabolic illness(etoh abuse, lung infection ) as evidenced by increased estimated needs from protein.  GOAL:   Patient will meet greater than or equal to 90% of their needs  MONITOR:   PO intake, Supplement acceptance, Labs, Weight trends, I & O's  ASSESSMENT:   61 year old white male with history of etoh abuse and smoking presenting with cough and weight loss. Pt found to have Empyema as well as other underlying lung infection   Pt s/p chest tube placement 12/5  Pt continues to do well; eating 40-100% of meals, drinking Ensure and eating Magic Cups. No new weight since 12/7; recommend obtain new weight. Pt continues to have chest tube. Per MD note, no surgery at this time. Per chart, no BM since 12/7; recommend bowel regimen as needed.    Medications reviewed and include: folic acid, MVI, nicotine, thiamine, senokot, unasyn   Labs reviewed: K 3.7 wnl, P 3.7 wnl, Mg 1.8 wnl- 12/6 Hgb 10.6(L), Hct 30.8(L)- 12/7   Diet Order:  Diet regular Room service appropriate? Yes; Fluid consistency: Thin  EDUCATION NEEDS:   Education needs have been addressed  Skin:  Reviewed RN Assessment  Last BM:  12/7- type 4  Height:   Ht Readings from Last 1 Encounters:  08/11/17 5\' 7"  (1.702 m)    Weight:   Wt Readings from Last 1 Encounters:  08/17/17 129 lb 4.8 oz (58.7 kg)    Ideal Body Weight:  67.27 kg  BMI:  Body mass index is 20.25 kg/m.  Estimated Nutritional Needs:   Kcal:  1700-2000kcal/day   Protein:  83-94g/day   Fluid:  >1.7L/day   Koleen Distance MS, RD, LDN Pager #[phone] After Hours Pager: [phone]

## 2017-08-20 NOTE — Progress Notes (Signed)
[place] at [place] NAME: Adam Boyd    MR#:  [id number]  DATE OF BIRTH:  Jan 26, 1956  SUBJECTIVE:  CHIEF COMPLAINT:   Chief Complaint  Patient presents with  . Cough   The patient has no complaints.  Chest tube in situ. REVIEW OF SYSTEMS:  Review of Systems  Constitutional: Negative for chills, fever and malaise/fatigue.  HENT: Positive for hearing loss. Negative for sore throat.   Eyes: Negative for blurred vision and double vision.  Respiratory: Negative for cough, hemoptysis, shortness of breath, wheezing and stridor.   Cardiovascular: Negative for chest pain, palpitations, orthopnea and leg swelling.  Gastrointestinal: Negative for abdominal pain, blood in stool, diarrhea, melena, nausea and vomiting.  Genitourinary: Negative for dysuria, flank pain and hematuria.  Musculoskeletal: Negative for back pain and joint pain.  Neurological: Negative for dizziness, sensory change, focal weakness, seizures, loss of consciousness, weakness and headaches.  Endo/Heme/Allergies: Negative for polydipsia.  Psychiatric/Behavioral: Negative for depression. The patient is not nervous/anxious.     DRUG ALLERGIES:  No Known Allergies VITALS:  Blood pressure 124/72, pulse 65, temperature 98.2 F (36.8 C), temperature source Oral, resp. rate 16, height 5\' 7"  (1.702 m), weight 129 lb 4.8 oz (58.7 kg), SpO2 99 %. PHYSICAL EXAMINATION:  Physical Exam  Constitutional: He is oriented to person, place, and time.  Malnutrition  HENT:  Head: Normocephalic.  Mouth/Throat: Oropharynx is clear and moist.  Eyes: Conjunctivae and EOM are normal. Pupils are equal, round, and reactive to light. No scleral icterus.  Neck: Normal range of motion. Neck supple. No JVD present. No tracheal deviation present.  Cardiovascular: Normal rate, regular rhythm and normal heart sounds. Exam reveals no gallop.  No murmur heard. Pulmonary/Chest: Effort normal and breath  sounds normal. No respiratory distress. He has no wheezes. He has no rales.  Abdominal: Soft. Bowel sounds are normal. He exhibits no distension. There is no tenderness. There is no rebound.  Musculoskeletal: Normal range of motion. He exhibits no edema or tenderness.  Neurological: He is alert and oriented to person, place, and time. No cranial nerve deficit.  Skin: No rash noted. No erythema.  Psychiatric: Affect normal.   LABORATORY PANEL:  Male CBC Recent Labs  Lab 08/17/17 0413  WBC 6.6  HGB 10.6*  HCT 30.8*  PLT 697*   ------------------------------------------------------------------------------------------------------------------ Chemistries  Recent Labs  Lab 08/15/17 0332 08/16/17 0341  NA 135  --   K 3.6 3.7  CL 105  --   CO2 22  --   GLUCOSE 119*  --   BUN 7  --   CREATININE 0.77  --   CALCIUM 8.0*  --   MG  --  1.8   RADIOLOGY:  No results found. ASSESSMENT AND PLAN:   Pt is 61 year old white male with history of smoking presenting with cough and weight loss  1. Right-sided empyema. s/p right chest tube placement with tPA infusion. CT chest showed much improved lung expansion and mild pleural effusion. No need for surgery due to much improved pleural effusion, though still some pleural thickening per Dr. Genevive Bi. He did tPA infusion again today. Discontinued Zosyned and vanco, started Unasyn. Continue unasyn while inpatient but at dc can go home on Augmentin 875 bid for prolonged course of 4-6 weeks per Dr. Ola Spurr.  2.  Hyponatremia possibly due to SIADH and or due to dehydration. Improved with normal saline IV.  3.  Nicotine abuse smoking cessation provided, on nicotine patch.  Alcohol abuse and withdrawal.  On CIWA protocol.  Withdrawal improved.  Severe malnutrition.  Follow dietitian's recommendation.  All the records are reviewed and case discussed with Care Management/Social Worker. Management plans discussed with the patient, his sisters  and they are in agreement.  CODE STATUS: Full Code  TOTAL TIME TAKING CARE OF THIS PATIENT: 28 minutes.   More than 50% of the time was spent in counseling/coordination of care: YES  POSSIBLE D/C IN 2 DAYS, DEPENDING ON CLINICAL CONDITION.   Demetrios Loll M.D on 08/20/2017 at 2:42 PM  Between 7am to 6pm - Pager - [phone]  After 6pm go to [email or web address] - [facility]

## 2017-08-21 ENCOUNTER — Other Ambulatory Visit: Payer: Self-pay

## 2017-08-21 DIAGNOSIS — J869 Pyothorax without fistula: Secondary | ICD-10-CM

## 2017-08-21 LAB — BLOOD GAS, VENOUS
Acid-Base Excess: 2.4 mmol/L — ABNORMAL HIGH (ref 0.0–2.0)
BICARBONATE: 26.4 mmol/L (ref 20.0–28.0)
Patient temperature: 37
pCO2, Ven: 38 mmHg — ABNORMAL LOW (ref 44.0–60.0)
pH, Ven: 7.45 — ABNORMAL HIGH (ref 7.250–7.430)

## 2017-08-21 LAB — BASIC METABOLIC PANEL
Anion gap: 10 (ref 5–15)
BUN: 15 mg/dL (ref 6–20)
CALCIUM: 8.7 mg/dL — AB (ref 8.9–10.3)
CHLORIDE: 104 mmol/L (ref 101–111)
CO2: 22 mmol/L (ref 22–32)
CREATININE: 0.66 mg/dL (ref 0.61–1.24)
GFR calc Af Amer: >60 mL/min (ref >60)
GFR calc non Af Amer: >60 mL/min (ref >60)
GLUCOSE: 97 mg/dL (ref 65–99)
Potassium: 4.1 mmol/L (ref 3.5–5.1)
Sodium: 136 mmol/L (ref 135–145)

## 2017-08-21 LAB — CBC
HEMATOCRIT: 34.3 % — AB (ref 40.0–52.0)
HEMOGLOBIN: 12 g/dL — AB (ref 13.0–18.0)
MCH: 33.1 pg (ref 26.0–34.0)
MCHC: 35 g/dL (ref 32.0–36.0)
MCV: 94.5 fL (ref 80.0–100.0)
Platelets: 714 10*3/uL — ABNORMAL HIGH (ref 150–440)
RBC: 3.63 MIL/uL — ABNORMAL LOW (ref 4.40–5.90)
RDW: 13.4 % (ref 11.5–14.5)
WBC: 10.5 10*3/uL (ref 3.8–10.6)

## 2017-08-21 MED ORDER — AMOXICILLIN-POT CLAVULANATE 875-125 MG PO TABS
1.0000 | ORAL_TABLET | Freq: Two times a day (BID) | ORAL | Status: DC
  Administered 2017-08-21: 1 via ORAL
  Filled 2017-08-21: qty 1

## 2017-08-21 MED ORDER — AMOXICILLIN-POT CLAVULANATE 875-125 MG PO TABS: 1.0000 | ORAL_TABLET | Freq: Two times a day (BID) | ORAL | 0 refills | Status: DC

## 2017-08-21 NOTE — Discharge Instructions (Signed)
°  Keep chest tube and follow up Dr. Genevive Bi as outpatient.

## 2017-08-21 NOTE — Care Management (Addendum)
Patient to discharge home to his sister's residence.  Patient has a single chest tube that will be connected to a foley bag.  Sister states that Dr. Genevive Bi has provided education on how to drain and clean the chest tube.   Patient will not require home health charity nursing.  Corene Cornea with [facility] notified. Patient to follow up at Dr. Genevive Bi office weekly, first appointment 08/24/17.  Patient to discharge on oral antibiotics.  Script was faxed to [facility].  Patient's sister will pick up after discharge, at no cost.  Patient does not have PCP.  Provided with application to [facility], [facility] , and "[facility]:  Your Guide to [employer] [facility] [employer] [facility]"  Booklet. RNCM signing off.

## 2017-08-21 NOTE — Discharge Summary (Signed)
[facility] NAME: Adam Boyd    MR#:  [id number]  DATE OF BIRTH:  1955-09-26  DATE OF ADMISSION:  08/11/2017   ADMITTING PHYSICIAN: Dustin Flock, MD  DATE OF DISCHARGE:  08/21/2017 PRIMARY CARE PHYSICIAN: [name], MD   ADMISSION DIAGNOSIS:  Pulmonary nodules [R91.8] Atypical pneumonia [J18.9] Empyema ([place]) [J86.9] DISCHARGE DIAGNOSIS:  Active Problems:   Empyema ([place])  SECONDARY DIAGNOSIS:   Past Medical History:  Diagnosis Date  . Arthritis    bad back, pinched nerve  . Asymmetrical sensorineural hearing loss 2012   Right ear (odd appearance), s/p eval by ENT and MRI, never followed up.  will recommend pt f/u.  Marland Kitchen Depression   . History of chicken pox   . Hx: UTI (urinary tract infection)   . Smoker    HOSPITAL COURSE:   Pt is 61 year old white male with history of smoking presenting with cough and weight loss  1.Right-sided empyema. s/p right chest tube placement with tPA infusion. CT chest showed much improved lung expansion and mild pleural effusion. No need for surgery due to much improved pleural effusion, though still some pleural thickening per Dr. Genevive Bi. He did tPA infusion again yesterday. Discontinued Zosyned and vanco, started Unasyn. Continue unasyn while inpatient but at dc can go home on Augmentin 875 bid for prolonged course of 4-6 weeks per Dr. Ola Spurr.  2.Hyponatremia possibly due to SIADH and or due to dehydration. Improved with normal saline IV.  3.Nicotine abuse smoking cessation provided, on nicotine patch.  Alcohol abuse and withdrawal.  On CIWA protocol.  Withdrawal improved.  Severe malnutrition.  Follow dietitian's recommendation. DISCHARGE CONDITIONS:  Stable, discharged to home today. CONSULTS OBTAINED:  Treatment Team:  Nestor Lewandowsky, MD Leonel Ramsay, MD DRUG ALLERGIES:  No Known Allergies DISCHARGE MEDICATIONS:   Allergies as of 08/21/2017    No Known Allergies     Medication List    STOP taking these medications   TYLENOL 500 MG tablet Generic drug:  acetaminophen     TAKE these medications   amoxicillin-clavulanate 875-125 MG tablet Commonly known as:  AUGMENTIN Take 1 tablet by mouth every 12 (twelve) hours.        DISCHARGE INSTRUCTIONS:  See AVS.  If you experience worsening of your admission symptoms, develop shortness of breath, life threatening emergency, suicidal or homicidal thoughts you must seek medical attention immediately by calling 911 or calling your MD immediately  if symptoms less severe.  You Must read complete instructions/literature along with all the possible adverse reactions/side effects for all the Medicines you take and that have been prescribed to you. Take any new Medicines after you have completely understood and accpet all the possible adverse reactions/side effects.   Please note  You were cared for by a hospitalist during your hospital stay. If you have any questions about your discharge medications or the care you received while you were in the hospital after you are discharged, you can call the unit and asked to speak with the hospitalist on call if the hospitalist that took care of you is not available. Once you are discharged, your primary care physician will handle any further medical issues. Please note that NO REFILLS for any discharge medications will be authorized once you are discharged, as it is imperative that you return to your primary care physician (or establish a relationship with a primary care physician if you do not have one) for your aftercare needs so that  they can reassess your need for medications and monitor your lab values.    On the day of Discharge:  VITAL SIGNS:  Blood pressure 122/68, pulse 67, temperature (!) 97.5 F (36.4 C), temperature source Oral, resp. rate 20, height 5\' 7"  (1.702 m), weight 129 lb 4.8 oz (58.7 kg), SpO2 98 %. PHYSICAL EXAMINATION:    GENERAL:  61 y.o.-year-old patient lying in the bed with no acute distress.  EYES: Pupils equal, round, reactive to light and accommodation. No scleral icterus. Extraocular muscles intact.  HEENT: Head atraumatic, normocephalic. Oropharynx and nasopharynx clear.  NECK:  Supple, no jugular venous distention. No thyroid enlargement, no tenderness.  LUNGS: Normal breath sounds bilaterally, no wheezing, rales,rhonchi or crepitation. No use of accessory muscles of respiration.  CARDIOVASCULAR: S1, S2 normal. No murmurs, rubs, or gallops.  ABDOMEN: Soft, non-tender, non-distended. Bowel sounds present. No organomegaly or mass.  EXTREMITIES: No pedal edema, cyanosis, or clubbing.  NEUROLOGIC: Cranial nerves II through XII are intact. Muscle strength 5/5 in all extremities. Sensation intact. Gait not checked.  PSYCHIATRIC: The patient is alert and oriented x 3.  SKIN: No obvious rash, lesion, or ulcer.  DATA REVIEW:   CBC Recent Labs  Lab 08/21/17 0808  WBC 10.5  HGB 12.0*  HCT 34.3*  PLT 714*    Chemistries  Recent Labs  Lab 08/16/17 0341 08/21/17 0808  NA  --  136  K 3.7 4.1  CL  --  104  CO2  --  22  GLUCOSE  --  97  BUN  --  15  CREATININE  --  0.66  CALCIUM  --  8.7*  MG 1.8  --      Microbiology Results  Results for orders placed or performed during the hospital encounter of 08/11/17  Culture, blood (routine x 2)     Status: None   Collection Time: 08/11/17  7:09 PM  Result Value Ref Range Status   Specimen Description BLOOD RIGHT FOREARM  Final   Special Requests   Final    BOTTLES DRAWN AEROBIC AND ANAEROBIC Blood Culture adequate volume   Culture NO GROWTH 5 DAYS  Final   Report Status 08/16/2017 FINAL  Final  Culture, blood (routine x 2)     Status: Abnormal   Collection Time: 08/11/17  7:36 PM  Result Value Ref Range Status   Specimen Description BLOOD RIGHT ANTECUBITAL  Final   Special Requests   Final    BOTTLES DRAWN AEROBIC AND ANAEROBIC Blood Culture  results may not be optimal due to an excessive volume of blood received in culture bottles   Culture  Setup Time   Final    GRAM POSITIVE COCCI AEROBIC BOTTLE ONLY CRITICAL RESULT CALLED TO, READ BACK BY AND VERIFIED WITH: DAVID BESANTI AT 0522 08/15/17 SDR GRAM STAIN REVIEWED-AGREE WITH RESULT    Culture (A)  Final    MICROCOCCUS LUTEUS/LYLAE Standardized susceptibility testing for this organism is not available. Performed at [facility], [place] [address] [place], [place] [address]    Report Status 08/16/2017 FINAL  Final  Blood Culture ID Panel (Reflexed)     Status: None   Collection Time: 08/11/17  7:36 PM  Result Value Ref Range Status   Enterococcus species NOT DETECTED NOT DETECTED Final   Listeria monocytogenes NOT DETECTED NOT DETECTED Final   Staphylococcus species NOT DETECTED NOT DETECTED Final   Staphylococcus aureus NOT DETECTED NOT DETECTED Final   Streptococcus species NOT DETECTED NOT DETECTED Final  Streptococcus agalactiae NOT DETECTED NOT DETECTED Final   Streptococcus pneumoniae NOT DETECTED NOT DETECTED Final   Streptococcus pyogenes NOT DETECTED NOT DETECTED Final   Acinetobacter baumannii NOT DETECTED NOT DETECTED Final   Enterobacteriaceae species NOT DETECTED NOT DETECTED Final   Enterobacter cloacae complex NOT DETECTED NOT DETECTED Final   Escherichia coli NOT DETECTED NOT DETECTED Final   Klebsiella oxytoca NOT DETECTED NOT DETECTED Final   Klebsiella pneumoniae NOT DETECTED NOT DETECTED Final   Proteus species NOT DETECTED NOT DETECTED Final   Serratia marcescens NOT DETECTED NOT DETECTED Final   Haemophilus influenzae NOT DETECTED NOT DETECTED Final   Neisseria meningitidis NOT DETECTED NOT DETECTED Final   Pseudomonas aeruginosa NOT DETECTED NOT DETECTED Final   Candida albicans NOT DETECTED NOT DETECTED Final   Candida glabrata NOT DETECTED NOT DETECTED Final   Candida krusei NOT DETECTED NOT DETECTED Final   Candida parapsilosis NOT  DETECTED NOT DETECTED Final   Candida tropicalis NOT DETECTED NOT DETECTED Final  Body fluid culture     Status: None   Collection Time: 08/15/17  2:45 PM  Result Value Ref Range Status   Specimen Description PLEURAL  Final   Special Requests Normal  Final   Gram Stain   Final    ABUNDANT WBC PRESENT, PREDOMINANTLY PMN FEW GRAM POSITIVE COCCI    Culture   Final    ABUNDANT VIRIDANS STREPTOCOCCUS NO ANAEROBES ISOLATED Performed at [facility], [address], [place], [place] [address]    Report Status 08/19/2017 FINAL  Final   Organism ID, Bacteria VIRIDANS STREPTOCOCCUS  Final      Susceptibility   Viridans streptococcus - MIC*    PENICILLIN <=0.06 SENSITIVE Sensitive     CEFTRIAXONE 0.25 SENSITIVE Sensitive     ERYTHROMYCIN <=0.12 SENSITIVE Sensitive     LEVOFLOXACIN 0.5 SENSITIVE Sensitive     VANCOMYCIN 0.5 SENSITIVE Sensitive     * ABUNDANT VIRIDANS STREPTOCOCCUS  Acid Fast Smear (AFB)     Status: None   Collection Time: 08/15/17  2:45 PM  Result Value Ref Range Status   AFB Specimen Processing Concentration  Final   Acid Fast Smear Negative  Final    Comment: (NOTE) Performed At: [facility] [address] [place], [place] [id number] Rush Farmer MD QZ:[id number]    Source (AFB) PLEURAL  Final  Culture, fungus without smear     Status: None (Preliminary result)   Collection Time: 08/15/17  2:45 PM  Result Value Ref Range Status   Specimen Description PLEURAL  Final   Special Requests Normal  Final   Culture   Final    NO FUNGUS ISOLATED AFTER 6 DAYS Performed at [facility], [address], [place],  [address]    Report Status PENDING  Incomplete  MRSA PCR Screening     Status: None   Collection Time: 08/19/17 10:00 AM  Result Value Ref Range Status   MRSA by PCR NEGATIVE NEGATIVE Final    Comment:        The GeneXpert MRSA Assay (FDA approved for NASAL specimens only), is one component of a comprehensive MRSA  colonization surveillance program. It is not intended to diagnose MRSA infection nor to guide or monitor treatment for MRSA infections.     RADIOLOGY:  No results found.   Management plans discussed with the patient, his sister and they are in agreement.  CODE STATUS: Full Code   TOTAL TIME TAKING CARE OF THIS PATIENT: 33 minutes.  Demetrios Loll M.D on 08/21/2017 at 1:22 PM  Between 7am to 6pm - Pager - [phone]  After 6pm go to [email or web address] - Proofreader  [employer]  Hospitalists  Office  [phone]  CC: Primary care physician; [name], MD   Note: This dictation was prepared with Dragon dictation along with smaller phrase technology. Any transcriptional errors that result from this process are unintentional.

## 2017-08-21 NOTE — Progress Notes (Signed)
Mr. Witty is more interactive today.  He is more appropriate and coherent.  His speech is intelligible.  He has no complaints today.  His chest tube is draining more serous type fluid.  I reviewed his x-rays with our interventional radiologist and they did not feel that replacing the tube or manipulating it in any way would be possible.  They felt that there was no pleural fluid to drain.  I discussed his care today with his sister with whom the patient will be staying.  It has been recommended by Dr. Ola Spurr to be placed on oral antibiotics.  I would like to try to find a connector with a stopcock so that we can flush the tube as needed.  His sister who is very knowledgeable can participate in the care of the patient at home.  Otherwise will place the tube to a Foley bag with a Heimlich valve.  I will see the patient weekly as an outpatient.

## 2017-08-21 NOTE — Progress Notes (Signed)
Adam Boyd  A and O x 4. VSS. Pt tolerating diet well. No complaints of pain or nausea. IV removed intact, prescriptions given. Given supplies and reinforce pt education on how to take care of the chest tube. Pt voiced understanding of discharge instructions with no further questions. Pt discharged via wheelchair with axillary.    Allergies as of 08/21/2017   No Known Allergies     Medication List    STOP taking these medications   TYLENOL 500 MG tablet Generic drug:  acetaminophen     TAKE these medications   amoxicillin-clavulanate 875-125 MG tablet Commonly known as:  AUGMENTIN Take 1 tablet by mouth every 12 (twelve) hours.       Vitals:   08/20/17 2045 08/21/17 0420  BP: 112/63 122/68  Pulse: 73 67  Resp: 20 20  Temp: 98 F (36.7 C) (!) 97.5 F (36.4 C)  SpO2: 98% 98%    Francesco Sor

## 2017-08-22 ENCOUNTER — Telehealth: Payer: Self-pay | Admitting: Cardiothoracic Surgery

## 2017-08-22 ENCOUNTER — Emergency Department
Admission: EM | Admit: 2017-08-22 | Discharge: 2017-08-22 | Disposition: A | Discharge status: Home / Self Care | Payer: Medicaid Other | Attending: Emergency Medicine | Admitting: Emergency Medicine

## 2017-08-22 ENCOUNTER — Telehealth: Payer: Self-pay

## 2017-08-22 ENCOUNTER — Encounter: Payer: Self-pay | Admitting: Emergency Medicine

## 2017-08-22 ENCOUNTER — Emergency Department: Payer: Medicaid Other

## 2017-08-22 DIAGNOSIS — F17210 Nicotine dependence, cigarettes, uncomplicated: Secondary | ICD-10-CM | POA: Diagnosis not present

## 2017-08-22 DIAGNOSIS — Y828 Other medical devices associated with adverse incidents: Secondary | ICD-10-CM | POA: Insufficient documentation

## 2017-08-22 DIAGNOSIS — T859XXA Unspecified complication of internal prosthetic device, implant and graft, initial encounter: Secondary | ICD-10-CM | POA: Diagnosis present

## 2017-08-22 DIAGNOSIS — J449 Chronic obstructive pulmonary disease, unspecified: Secondary | ICD-10-CM | POA: Insufficient documentation

## 2017-08-22 NOTE — ED Provider Notes (Signed)
[facility] Emergency Department Provider Note  ____________________________________________  Time seen: Approximately 10:54 PM  I have reviewed the triage vital signs and the nursing notes.   HISTORY  Chief Complaint Post-op Problem    HPI Adam Boyd is a 61 y.o. male reports being recently discharged from the hospital after care of pneumonia and empyema. He is on Augmentin which is been taking as prescribed. He has a chest tube in place which is been draining well, but today he notes that it stopped draining. He tried flushing it at home without relief of the obstruction. He has no new or worsening symptoms. He has follow-up with Dr. Faith Rogue in 2 days.     Past Medical History:  Diagnosis Date  . Arthritis    bad back, pinched nerve  . Asymmetrical sensorineural hearing loss 2012   Right ear (odd appearance), s/p eval by ENT and MRI, never followed up.  will recommend pt f/u.  Marland Kitchen Depression   . History of chicken pox   . Hx: UTI (urinary tract infection)   . Smoker      Patient Active Problem List   Diagnosis Date Noted  . Empyema ([place]) 08/11/2017  . Back pain 09/15/2011  . Tick bite 03/12/2011  . Cough 03/12/2011  . Healthcare maintenance 03/12/2011  . Arthritis   . Smoker   . COPD (chronic obstructive pulmonary disease) ([place]) 03/10/2011     Past Surgical History:  Procedure Laterality Date  . CATARACT EXTRACTION     bilateral, 5 years ago  . 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  scattere small foci of hyperintensity (sequela or chronic small vessel ischemia vs demyelinating disorder, mild fluid R mastoid air cells, paranasal sinus disease     Prior to Admission medications   Medication Sig Start Date End Date Taking? Authorizing Provider  amoxicillin-clavulanate (AUGMENTIN) 875-125 MG tablet Take 1 tablet by mouth every 12 (twelve) hours. 08/21/17    Demetrios Loll, MD     Allergies Patient has no known allergies.   Family History  Problem Relation Age of Onset  . Hypertension Mother   . Diabetes Mother   . Stroke Paternal Grandmother   . Coronary artery disease Neg Hx   . Cancer Neg Hx     Social History Social History   Tobacco Use  . Smoking status: Current Every Day Smoker    Packs/day: 1.00    Years: 42.00    Pack years: 42.00    Types: Cigarettes  . Smokeless tobacco: Never Used  Substance Use Topics  . Alcohol use: Yes    Comment: 1-2 beers/day  . Drug use: No    Review of Systems  Constitutional:   No fever or chills.  Cardiovascular:   No chest pain or syncope. Respiratory:   No dyspnea or cough. Gastrointestinal:   Negative for abdominal pain, vomiting and diarrhea.  Musculoskeletal:   Negative for focal pain or swelling All other systems reviewed and are negative except as documented above in ROS and HPI.  ____________________________________________   PHYSICAL EXAM:  VITAL SIGNS: ED Triage Vitals [08/22/17 1950]  Enc Vitals Group     BP 110/72     Pulse Rate (!) 102     Resp 18     Temp 98.7 F (37.1 C)     Temp Source Oral     SpO2  98 %     Weight 129 lb (58.5 kg)     Height 5\' 7"  (1.702 m)     Head Circumference      Peak Flow      Pain Score      Pain Loc      Pain Edu?      Excl. in [place]?     Vital signs reviewed, nursing assessments reviewed.   Constitutional:   Alert and oriented. Well appearing and in no distress. Eyes:   No scleral icterus.  EOMI.  ENT   Head:   Normocephalic and atraumatic.  Hematological/Lymphatic/Immunilogical:   No cervical lymphadenopathy. Cardiovascular:   RRR. Symmetric bilateral radial and DP pulses.  No murmurs.  Respiratory:   Normal respiratory effort without tachypnea/retractions. Breath sounds are clear and equal bilaterally. No wheezes/rales/rhonchi. Gastrointestinal:   Soft and nontender. Non distended. There is no CVA tenderness.  No  rebound, rigidity, or guarding. Genitourinary:   deferred Musculoskeletal:   Normal range of motion in all extremities. No joint effusions.  No lower extremity tenderness.  No edema. Right chest wall with chest tube in place, no inflammatory changes, no tenderness at the insertion site. Neurologic:   Normal speech and language.  Motor grossly intact. No gross focal neurologic deficits are appreciated.  Skin:    Skin is warm, dry and intact. No rash noted.  No petechiae, purpura, or bullae.  ____________________________________________    LABS (pertinent positives/negatives) (all labs ordered are listed, but only abnormal results are displayed) Labs Reviewed - No data to display ____________________________________________   EKG    ____________________________________________    RADIOLOGY  Dg Chest 2 View  Result Date: 08/22/2017 CLINICAL DATA:  c/o draining tube being clogged and no longer draining. Patient has chest tube placed in his right lung due to empyema. Patient's family states that the last time they saw it draining was this morning Hx of smoker EXAM: CHEST  2 VIEW COMPARISON:  Chest radiograph 08/18/2017, CT 08/18/2017 FINDINGS: Normal cardiac silhouette. Small bore RIGHT chest tube RIGHT lateral lung base. Small RIGHT effusion not changed. No pneumothorax. LEFT lung clear. IMPRESSION: Small RIGHT chest tube in place with persistent RIGHT pleural effusion which is small volume. No pneumothorax. Electronically Signed   By: Suzy Bouchard M.D.   On: 08/22/2017 20:19    ____________________________________________   PROCEDURES Procedures Chest tube care Tubing stripped manually by me One-way valve in tubing appear to be clotted with fibrinous debris. I clamped proximally and flush the tubing with good drainage afterward. No pain per patient. ____________________________________________     CLINICAL IMPRESSION / ASSESSMENT AND PLAN / ED COURSE  Pertinent labs &  imaging results that were available during my care of the patient were reviewed by me and considered in my medical decision making (see chart for details).   Is well-appearing no acute distress, presents with complication of his chest tube. He he has no chest pain or shortness of breath no fevers chills or any other new or worsening symptoms. He is doing great with his chest tube and Augmentin. Was able to flush the tube and restore good drainage, and he is suitable for discharge home and continued follow-up with Dr. Faith Rogue as scheduled in 2 days. Counseled on return precautions, but I expect he'll have a smooth course.      ____________________________________________   FINAL CLINICAL IMPRESSION(S) / ED DIAGNOSES    Final diagnoses:  Complication of chest tube, initial encounter      This  SmartLink is deprecated. Use AVSMEDLIST instead to display the medication list for a patient.   Portions of this note were generated with dragon dictation software. Dictation errors may occur despite best attempts at proofreading.    Carrie Mew, MD 08/22/17 2258

## 2017-08-22 NOTE — ED Triage Notes (Addendum)
Pt comes into the ED via POV c/o draining tube being clogged and no longer draining.  Patient has chest tube placed in his right lung due to empyema.  Patient's family states that the last time they saw it draining was this morning.  Patient has sediment present in the tube at this time and in NAD with even and unlabored respirations.

## 2017-08-22 NOTE — Telephone Encounter (Signed)
Attempted to reach patient to complete TCM and confirm hospital f/u appointment. Voicemail not set up. Will continue to call.

## 2017-08-22 NOTE — Telephone Encounter (Signed)
Patients sister calling and has questions about her brothers drain. Patient has an appointment on Friday with Dr. Genevive Bi Please call patients sister and advice.

## 2017-08-22 NOTE — ED Notes (Signed)
This RN changed bandage on chest tube. 4X4 were placed with a Tegaderm, pt wife was educated again on how to change the bandage if needed.

## 2017-08-22 NOTE — Telephone Encounter (Signed)
Returned call to patient sisters at this time. She advised that she was concerned with the built of debride that was in the tubing of the drain. She verbalized that it is keeping the fluid from draining properly. I asked if she has already flushed the drain and she advised that she has not for today. I advised her to flush the drain and see if that take clears out the build up and if so then great we will see him as scheduled for Friday. However if flushing the drain does not help then I would advise them to go to the ED to be seen for the drain and or call to be seen in the morning. I verbalized to patient's sister to call us first thing in the morning letting us know how the flush went. She verbalized understanding.

## 2017-08-23 ENCOUNTER — Ambulatory Visit (INDEPENDENT_AMBULATORY_CARE_PROVIDER_SITE_OTHER): Payer: Self-pay | Admitting: Cardiothoracic Surgery

## 2017-08-23 ENCOUNTER — Encounter: Payer: Self-pay | Admitting: Cardiothoracic Surgery

## 2017-08-23 VITALS — BP 112/80 | HR 123 | Temp 97.8°F | Resp 20 | Ht 67.0 in | Wt 121.4 lb

## 2017-08-23 DIAGNOSIS — J869 Pyothorax without fistula: Secondary | ICD-10-CM

## 2017-08-23 NOTE — Telephone Encounter (Signed)
Attempted to contact pt  to complete TCM and confirm hosp f/u appt; unable to leave vm

## 2017-08-23 NOTE — Patient Instructions (Signed)
Please see your follow up appointment listed below.  Please call Dr.Oaks tomorrow morning and let him know how much drainage you are having.

## 2017-08-23 NOTE — Progress Notes (Signed)
  Patient ID: Adam Boyd, male   DOB: 1955-12-14, 61 y.o.   MRN: [id number]  HISTORY: He comes to the office today after being seen in the emergency room last night.  He is accompanied by his sister who is his caregiver.  She states that he has been doing very well at home without any fevers or chills.  He does have occasional confusion which is improving.  She brought him to the emergency department because his drain appeared to be occluded.  Despite them flushing the catheter it appeared that the Heimlich valve was clogged.  Upon being seen in the emergency room the physician irrigated the catheter on multiple occasions.  There is now about 200 cc of slightly cloudy fluid within the urine bag.  There does not appear to be an air leak.  When I flushed the catheter today there was resistance after about 10 cc.   Vitals:   08/23/17 1031  BP: 112/80  Pulse: (!) 123  Resp: 20  Temp: 97.8 F (36.6 C)  SpO2: 95%     EXAM:    Resp: Lungs are clear bilaterally.  No respiratory distress, normal effort. Heart:  Regular without murmurs Abd:  Abdomen is soft, non distended and non tender. No masses are palpable.  There is no rebound and no guarding.  Neurological: Alert and oriented to person, place, and time. Coordination normal.  Skin: Skin is warm and dry. No rash noted. No diaphoretic. No erythema. No pallor.  Psychiatric: Normal mood and affect. Normal behavior. Judgment and thought content normal.   I have independently reviewed his chest x-ray from last night.  There is no pneumothorax or pleural effusion on it.  There is some pleural thickening.   ASSESSMENT: Right-sided pneumonia with empyema   PLAN:   I asked the patient and his sister to contact me tomorrow so that we can determine an accurate amount of drainage.  I replaced the Foley bag today so there is no fluid within the bag.  She will drain it tomorrow and she will continue the irrigated daily with 10 cc of saline.  I  will also see the patient back again on Monday.    Nestor Lewandowsky, MD

## 2017-08-23 NOTE — Progress Notes (Signed)
[email or web address]  

## 2017-08-24 ENCOUNTER — Encounter: Payer: Self-pay | Admitting: Cardiothoracic Surgery

## 2017-08-24 ENCOUNTER — Ambulatory Visit (INDEPENDENT_AMBULATORY_CARE_PROVIDER_SITE_OTHER): Payer: Self-pay | Admitting: Cardiothoracic Surgery

## 2017-08-24 ENCOUNTER — Ambulatory Visit: Payer: Self-pay | Admitting: Cardiothoracic Surgery

## 2017-08-24 VITALS — BP 127/78 | HR 118 | Temp 98.1°F | Resp 20 | Ht 67.0 in | Wt 123.0 lb

## 2017-08-24 DIAGNOSIS — J869 Pyothorax without fistula: Secondary | ICD-10-CM

## 2017-08-24 NOTE — Addendum Note (Signed)
Addended by: Celene Kras on: 08/24/2017 10:52 AM   Modules accepted: Orders

## 2017-08-24 NOTE — Progress Notes (Signed)
  Patient ID: Adam Boyd, male   DOB: 1956-06-26, 61 y.o.   MRN: [id number]  HISTORY: He returns today in follow-up.  He has had only a few cc of drainage over the last 24 hours.  It is clear yellow and nature.  He denies any fevers or chills.   Vitals:   08/24/17 1022  BP: 127/78  Pulse: (!) 118  Resp: 20  Temp: 98.1 F (36.7 C)  SpO2: 98%     EXAM:   The chest tube site is clean dry and intact.  There is no drainage from the wound.  I removed the chest tube.  That went without difficulty.   ASSESSMENT: Empyema right chest   PLAN:   I will see the patient back again in 2 weeks.  Will obtain a chest x-ray at that time.  He is to continue on his oral antibiotics for the next 4 weeks.    Nestor Lewandowsky, MD

## 2017-08-24 NOTE — Patient Instructions (Signed)
You will need to have your chest xray prior to seeing Dr.Oaks.  Please see your follow up appointment listed below.   Please keep a dressing over the drain site until it closes completely.

## 2017-08-27 ENCOUNTER — Encounter: Payer: Self-pay | Admitting: Cardiothoracic Surgery

## 2017-08-31 ENCOUNTER — Ambulatory Visit: Payer: Self-pay | Admitting: Family Medicine

## 2017-08-31 ENCOUNTER — Encounter: Payer: Self-pay | Admitting: Family Medicine

## 2017-08-31 VITALS — BP 114/60 | HR 71 | Temp 97.9°F | Wt 128.0 lb

## 2017-08-31 DIAGNOSIS — F17200 Nicotine dependence, unspecified, uncomplicated: Secondary | ICD-10-CM

## 2017-08-31 DIAGNOSIS — Z598 Other problems related to housing and economic circumstances: Secondary | ICD-10-CM

## 2017-08-31 DIAGNOSIS — Z5971 Insufficient health insurance coverage: Secondary | ICD-10-CM

## 2017-08-31 DIAGNOSIS — D473 Essential (hemorrhagic) thrombocythemia: Secondary | ICD-10-CM

## 2017-08-31 DIAGNOSIS — E43 Unspecified severe protein-calorie malnutrition: Secondary | ICD-10-CM

## 2017-08-31 DIAGNOSIS — R413 Other amnesia: Secondary | ICD-10-CM

## 2017-08-31 DIAGNOSIS — Z7289 Other problems related to lifestyle: Secondary | ICD-10-CM

## 2017-08-31 DIAGNOSIS — Z5989 Other problems related to housing and economic circumstances: Secondary | ICD-10-CM

## 2017-08-31 DIAGNOSIS — D75839 Thrombocytosis, unspecified: Secondary | ICD-10-CM

## 2017-08-31 DIAGNOSIS — F1090 Alcohol use, unspecified, uncomplicated: Secondary | ICD-10-CM

## 2017-08-31 DIAGNOSIS — Z789 Other specified health status: Secondary | ICD-10-CM

## 2017-08-31 DIAGNOSIS — J869 Pyothorax without fistula: Secondary | ICD-10-CM

## 2017-08-31 LAB — CBC WITH DIFFERENTIAL/PLATELET
BASOS ABS: 0.1 10*3/uL (ref 0.0–0.1)
Basophils Relative: 1.7 % (ref 0.0–3.0)
Eosinophils Absolute: 0.3 10*3/uL (ref 0.0–0.7)
Eosinophils Relative: 7 % — ABNORMAL HIGH (ref 0.0–5.0)
HCT: 33.3 % — ABNORMAL LOW (ref 39.0–52.0)
HEMOGLOBIN: 11 g/dL — AB (ref 13.0–17.0)
LYMPHS ABS: 1.6 10*3/uL (ref 0.7–4.0)
Lymphocytes Relative: 32 % (ref 12.0–46.0)
MCHC: 32.9 g/dL (ref 30.0–36.0)
MCV: 96.2 fl (ref 78.0–100.0)
MONOS PCT: 11.1 % (ref 3.0–12.0)
Monocytes Absolute: 0.5 10*3/uL (ref 0.1–1.0)
NEUTROS PCT: 48.2 % (ref 43.0–77.0)
Neutro Abs: 2.3 10*3/uL (ref 1.4–7.7)
Platelets: 593 10*3/uL — ABNORMAL HIGH (ref 150.0–400.0)
RBC: 3.46 Mil/uL — AB (ref 4.22–5.81)
RDW: 13.3 % (ref 11.5–15.5)
WBC: 4.8 10*3/uL (ref 4.0–10.5)

## 2017-08-31 MED ORDER — FOLIC ACID 1 MG PO TABS: 1.0000 mg | ORAL_TABLET | Freq: Every day | ORAL | Status: DC

## 2017-08-31 MED ORDER — VITAMIN B-12 1000 MCG PO TABS: 1000.0000 ug | ORAL_TABLET | Freq: Every day | ORAL | Status: DC

## 2017-08-31 NOTE — Progress Notes (Signed)
BP 114/60 (BP Location: Left Arm, Patient Position: Sitting, Cuff Size: Normal)   Pulse 71   Temp 97.9 F (36.6 C) (Oral)   Wt 128 lb (58.1 kg)   SpO2 98%   BMI 20.05 kg/m    CC: hosp f/u visit Subjective:    Patient ID: [name], male    DOB: 06/23/1956, 61 y.o.   MRN: [id number]  HPI: Adam Boyd is a 61 y.o. male presenting on 08/31/2017 for Hospitalization Follow-up (Admitted to [facility] 08/11/17 via ED visit for pneumonia. Then seen at [facility] ED 08/22/17 due to trouble with chest tube)   Here with sister Adam Boyd.  I last saw patient 2012.   Recent hospitalization 08/11/2017 - 08/21/2017 for atypical pneumonia complicated by R sided empyema s/p chest tube placement and tPA infusion - treated with vanc and zosyn, discharged with augmentin oral course x 4-6 wks (per ID recs [facility]). Has had f/u with CT surgery [facility]). He was also found to have hyponatremia.   Feels "the best I've ever felt". Since home denies chest pain, dyspnea, fevers/chills, coughing, abd pain. Normal bowels and bladder.   Ex-smoker - quit since hospitalization. Prior 1 ppd x 42 yrs.  Alcohol use - 2-3 beers/night. He's stopped drinking since hospitalization.  Severe malnutrition while hospitalized.   Here with sister today who is concerned about patient's memory. Pt denies concerns. Note she gives me states increased confusion, trouble speaking, and word finding difficulties progressive over the last several years. He continues to drive, sister concerned about this. fmhx dementia (father).   Prior he was living with son in "house with mold". Has moved out of this situation.  Relevant past medical, surgical, family and social history reviewed and updated as indicated. Interim medical history since our last visit reviewed. Allergies and medications reviewed and updated. Outpatient Medications Prior to Visit  Medication Sig Dispense Refill  . amoxicillin-clavulanate (AUGMENTIN) 875-125 MG tablet Take 1  tablet by mouth every 12 (twelve) hours. 60 tablet 0   No facility-administered medications prior to visit.      Per HPI unless specifically indicated in ROS section below Review of Systems     Objective:    BP 114/60 (BP Location: Left Arm, Patient Position: Sitting, Cuff Size: Normal)   Pulse 71   Temp 97.9 F (36.6 C) (Oral)   Wt 128 lb (58.1 kg)   SpO2 98%   BMI 20.05 kg/m   Wt Readings from Last 3 Encounters:  08/31/17 128 lb (58.1 kg)  08/24/17 123 lb (55.8 kg)  08/23/17 121 lb 6.4 oz (55.1 kg)  Wt = 151 lbs (03/10/2011)  Physical Exam  Constitutional: He appears well-developed. No distress.  thin  HENT:  Head: Normocephalic and atraumatic.  Right Ear: External ear and ear canal normal. Decreased hearing is noted.  Left Ear: External ear and ear canal normal. Decreased hearing is noted.  Mouth/Throat: Oropharynx is clear and moist. No oropharyngeal exudate.  Eyes: Conjunctivae are normal. Pupils are equal, round, and reactive to light.  Neck: Normal range of motion. Neck supple. No thyromegaly present.  Cardiovascular: Normal rate, regular rhythm, normal heart sounds and intact distal pulses.  No murmur heard. Pulmonary/Chest: Effort normal and breath sounds normal. No respiratory distress. He has no wheezes. He has no rales.  Abdominal: Soft. Bowel sounds are normal. He exhibits no distension and no mass. There is no tenderness. There is no rebound and no guarding.  Musculoskeletal: He exhibits no edema.  Lymphadenopathy:  He has no cervical adenopathy.  Neurological:  President: trump Day of the week: Thrusday (it's Friday) Month: October (it's December) Year: 2018 Registration 3/3 Recall 1/3, 1/3 with cues  Skin: Skin is warm and dry. No rash noted.  Psychiatric: He has a normal mood and affect.  Nursing note and vitals reviewed.      Assessment & Plan:   Problem List Items Addressed This Visit    Alcohol use    Reports abstinence over the last few  weeks.       Does not have health insurance    Encouraged he work towards Geophysical data processor or [place] eligibility.  # for [facility] provided to pt/sister to call and inquire.  I will also ask our care coordinator to touch base with pt/sister and offer further assistance if needed.       Empyema ([place]) - Primary    Atypical pneumonia with empyema complication s/p chest tube, IV abx, now on long term augmentin course. Seems to be improving. Has planned f/u with CT surgery.       Memory impairment    Concern for this by sister, pt denies any concerns. Poor result with limited memory testing in office today.  He is also very hard of hearing - discussed contribution of this to cognition/awareness - but he cannot afford hearing aides, declines audiology referral at this time, and amplifiers haven't really helped either.  He also has been suffering from malnutrition in setting of significant alcohol use - and states this is now better, he's not currently drinking.  I discussed my concerns with his driving at this time, encouraged he not drive while we further evaluate memory and work towards hearing eval.       Severe protein-calorie malnutrition ([place])    Alb 2.3 during recent hospitalization. Weight gain noted, endorses better nutrition status since he's moved in with sister - she reguarly cooks meals for him. Update prealbumin.  I encouraged he start folate and b12 vitamins      Relevant Orders   Prealbumin (Completed)   Smoker    Endorses quitting this month. 89 PY hx. Eligible for lung cancer screening. 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       Thrombocytosis ([place])    Found during recent hospitalization. Will repeat with periph smear today.       Relevant Orders   Pathologist smear review   CBC with Differential/Platelet (Completed)       Follow up plan: Return in about 6 months (around 03/01/2018) for follow up visit.  [name], MD

## 2017-08-31 NOTE — Patient Instructions (Addendum)
Call [facility] to ask about establishing care [phone] Labs today. I do want to work towards hearing evaluation with audiologist and possible hearing aides.  Work over next 3 months on 3 regular meals a day with protein.  Continue abstaining from alcohol and smoking.  Start folic acid 1mg  daily and vitamin B12 1072mcg daily - both over the counter. Return in 6 months for follow up visit.

## 2017-09-01 DIAGNOSIS — Z5989 Other problems related to housing and economic circumstances: Secondary | ICD-10-CM | POA: Insufficient documentation

## 2017-09-01 DIAGNOSIS — Z7289 Other problems related to lifestyle: Secondary | ICD-10-CM | POA: Insufficient documentation

## 2017-09-01 DIAGNOSIS — Z598 Other problems related to housing and economic circumstances: Secondary | ICD-10-CM | POA: Insufficient documentation

## 2017-09-01 DIAGNOSIS — Z789 Other specified health status: Secondary | ICD-10-CM | POA: Insufficient documentation

## 2017-09-01 DIAGNOSIS — F1090 Alcohol use, unspecified, uncomplicated: Secondary | ICD-10-CM | POA: Insufficient documentation

## 2017-09-01 NOTE — Assessment & Plan Note (Addendum)
Endorses quitting this month. 66 PY hx. Eligible for lung cancer screening. 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

## 2017-09-01 NOTE — Assessment & Plan Note (Signed)
Found during recent hospitalization. Will repeat with periph smear today.

## 2017-09-01 NOTE — Assessment & Plan Note (Signed)
Reports abstinence over the last few weeks.

## 2017-09-01 NOTE — Assessment & Plan Note (Signed)
Concern for this by sister, pt denies any concerns. Poor result with limited memory testing in office today.  He is also very hard of hearing - discussed contribution of this to cognition/awareness - but he cannot afford hearing aides, declines audiology referral at this time, and amplifiers haven't really helped either.  He also has been suffering from malnutrition in setting of significant alcohol use - and states this is now better, he's not currently drinking.  I discussed my concerns with his driving at this time, encouraged he not drive while we further evaluate memory and work towards hearing eval.

## 2017-09-01 NOTE — Assessment & Plan Note (Signed)
Atypical pneumonia with empyema complication s/p chest tube, IV abx, now on long term augmentin course. Seems to be improving. Has planned f/u with CT surgery.

## 2017-09-01 NOTE — Assessment & Plan Note (Addendum)
Alb 2.3 during recent hospitalization. Weight gain noted, endorses better nutrition status since he's moved in with sister - she reguarly cooks meals for him. Update prealbumin.  I encouraged he start folate and b12 vitamins

## 2017-09-01 NOTE — Assessment & Plan Note (Signed)
Encouraged he work towards Geophysical data processor or [place] eligibility.  # for [facility] provided to pt/sister to call and inquire.  I will also ask our care coordinator to touch base with pt/sister and offer further assistance if needed.

## 2017-09-03 LAB — PREALBUMIN: PREALBUMIN: 20 mg/dL — AB (ref 21–43)

## 2017-09-03 LAB — PATHOLOGIST SMEAR REVIEW

## 2017-09-05 LAB — CULTURE, FUNGUS WITHOUT SMEAR: Special Requests: NORMAL

## 2017-09-10 ENCOUNTER — Encounter: Payer: Self-pay | Admitting: *Deleted

## 2017-09-10 DIAGNOSIS — J869 Pyothorax without fistula: Secondary | ICD-10-CM

## 2017-09-13 NOTE — Telephone Encounter (Signed)
error 

## 2017-09-14 ENCOUNTER — Ambulatory Visit
Admission: RE | Admit: 2017-09-14 | Discharge: 2017-09-14 | Disposition: A | Discharge status: Home / Self Care | Payer: Medicaid Other | Source: Ambulatory Visit | Attending: Cardiothoracic Surgery | Admitting: Cardiothoracic Surgery

## 2017-09-14 ENCOUNTER — Ambulatory Visit (INDEPENDENT_AMBULATORY_CARE_PROVIDER_SITE_OTHER): Payer: Self-pay | Admitting: Cardiothoracic Surgery

## 2017-09-14 ENCOUNTER — Encounter: Payer: Self-pay | Admitting: Cardiothoracic Surgery

## 2017-09-14 VITALS — BP 114/73 | HR 73 | Temp 97.5°F | Ht 67.0 in | Wt 133.6 lb

## 2017-09-14 DIAGNOSIS — R918 Other nonspecific abnormal finding of lung field: Secondary | ICD-10-CM | POA: Insufficient documentation

## 2017-09-14 DIAGNOSIS — J869 Pyothorax without fistula: Secondary | ICD-10-CM

## 2017-09-14 NOTE — Progress Notes (Signed)
  Patient ID: Adam Boyd, male   DOB: July 16, 1956, 62 y.o.   MRN: [id number]  HISTORY: [name] returns today in follow-up.  He is accompanied today by his wife.  She states that he has been doing quite well at home.  He continues to take his amoxicillin and has about another week's worth of that.  He has no complaints today.  He states he feels quite well overall.   Vitals:   09/14/17 0806  BP: 114/73  Pulse: 73  Temp: (!) 97.5 F (36.4 C)  SpO2: 98%     EXAM:    Resp: Lungs are clear bilaterally.  No respiratory distress, normal effort. Heart:  Regular without murmurs Skin: Skin is warm and dry. No rash noted. No diaphoretic. No erythema. No pallor.  Psychiatric: Normal mood and affect. Normal behavior. Judgment and thought content normal.   I did review his chest x-ray which she had made today.  I see some pleural thickening on the right but no other abnormalities.   ASSESSMENT: Right-sided empyema managed nonoperatively   PLAN:   I told the patient and his family that they should continue his antibiotics until completed.  There is no change in his chest x-ray and he appears to be clinically responding well.  He should continue his follow-up with Dr. Ola Spurr and his primary care physician.  No additional follow-up was made today.    Nestor Lewandowsky, MD

## 2017-09-14 NOTE — Patient Instructions (Signed)
Please continue to follow up with Dr.Fitzgerald  Please call our office with questions or concerns.

## 2017-10-12 LAB — ACID FAST CULTURE WITH REFLEXED SENSITIVITIES

## 2017-10-12 LAB — ACID FAST CULTURE WITH REFLEXED SENSITIVITIES (MYCOBACTERIA): Acid Fast Culture: NEGATIVE

## 2018-01-04 ENCOUNTER — Telehealth: Payer: Self-pay | Admitting: Family Medicine

## 2018-01-04 NOTE — Telephone Encounter (Signed)
Copied from [place] [id number]. Topic: General - Other >> Jan 04, 2018 10:18 AM Margot Ables wrote: Reason for CRM: Requesting copy of H&P and last hospital discharge summary. Dr. Wilkie Aye is a special care dentist. Fax # [phone]

## 2018-01-04 NOTE — Telephone Encounter (Signed)
Medical records release needs to be faxed here

## 2018-01-04 NOTE — Telephone Encounter (Signed)
Left message asking [facility] @ Dr Wilkie Aye office to fax medicare records release

## 2018-01-15 NOTE — Telephone Encounter (Signed)
Received another fax asking for information. I faxed it back stating we needed Records Release Form from them with the patient's signature to disclose any information or records.

## 2018-01-22 ENCOUNTER — Telehealth: Payer: Self-pay | Admitting: Family Medicine

## 2018-01-22 DIAGNOSIS — Z789 Other specified health status: Secondary | ICD-10-CM

## 2018-01-22 DIAGNOSIS — E43 Unspecified severe protein-calorie malnutrition: Secondary | ICD-10-CM

## 2018-01-22 DIAGNOSIS — R937 Abnormal findings on diagnostic imaging of other parts of musculoskeletal system: Secondary | ICD-10-CM

## 2018-01-22 DIAGNOSIS — Z7289 Other problems related to lifestyle: Secondary | ICD-10-CM

## 2018-01-22 DIAGNOSIS — D473 Essential (hemorrhagic) thrombocythemia: Secondary | ICD-10-CM

## 2018-01-22 DIAGNOSIS — D75839 Thrombocytosis, unspecified: Secondary | ICD-10-CM

## 2018-01-22 NOTE — Telephone Encounter (Signed)
Asked Adam Boyd about Medicaid pt and was told we do not accept Medicaid.  Spoke with pt's sister, Adam Boyd, relaying this info.  Says she will get back with the dentist to determine the next step. Sent to Dr. Darnell Level as Juluis Rainier.

## 2018-01-22 NOTE — Telephone Encounter (Signed)
Spoke with pt's sister, Caren Griffins (on dpr), relaying Dr. Synthia Innocent message.  She is asking if we accept Medicaid.  Says pt was recently approved for Medicaid.  Explained I will have to do some research but will get back with her.  Caren Griffins says ok.

## 2018-01-22 NOTE — Telephone Encounter (Signed)
plz call patient or sister - received note from dentist regarding abnormal dental CT. I'd like him to come in sometime this week at his convenience for labwork to further evaluate bone health. Please schedule office visit 1 wk after labwork to review.

## 2018-01-24 NOTE — Telephone Encounter (Signed)
spoke with Dr Wilkie Aye regarding patient.

## 2018-03-01 ENCOUNTER — Ambulatory Visit: Payer: Self-pay | Admitting: Family Medicine

## 2018-04-04 ENCOUNTER — Encounter: Payer: Self-pay | Admitting: Radiation Oncology

## 2018-04-04 ENCOUNTER — Ambulatory Visit
Admission: RE | Admit: 2018-04-04 | Discharge: 2018-04-04 | Disposition: A | Discharge status: Home / Self Care | Payer: Self-pay | Source: Ambulatory Visit | Attending: Radiation Oncology | Admitting: Radiation Oncology

## 2018-04-04 ENCOUNTER — Ambulatory Visit
Admission: RE | Admit: 2018-04-04 | Discharge: 2018-04-04 | Disposition: A | Discharge status: Home / Self Care | Payer: No Typology Code available for payment source | Source: Ambulatory Visit | Attending: Radiation Oncology | Admitting: Radiation Oncology

## 2018-04-04 ENCOUNTER — Other Ambulatory Visit: Payer: Self-pay

## 2018-04-04 ENCOUNTER — Other Ambulatory Visit: Payer: Self-pay | Admitting: Radiation Oncology

## 2018-04-04 ENCOUNTER — Encounter (INDEPENDENT_AMBULATORY_CARE_PROVIDER_SITE_OTHER): Payer: Self-pay

## 2018-04-04 VITALS — BP 133/75 | HR 63 | Temp 97.8°F | Ht 66.0 in | Wt 151.2 lb

## 2018-04-04 DIAGNOSIS — F17210 Nicotine dependence, cigarettes, uncomplicated: Secondary | ICD-10-CM | POA: Diagnosis not present

## 2018-04-04 DIAGNOSIS — C20 Malignant neoplasm of rectum: Secondary | ICD-10-CM

## 2018-04-04 DIAGNOSIS — Z9889 Other specified postprocedural states: Secondary | ICD-10-CM | POA: Insufficient documentation

## 2018-04-04 DIAGNOSIS — Z8249 Family history of ischemic heart disease and other diseases of the circulatory system: Secondary | ICD-10-CM | POA: Insufficient documentation

## 2018-04-04 DIAGNOSIS — Z87440 Personal history of urinary (tract) infections: Secondary | ICD-10-CM | POA: Diagnosis not present

## 2018-04-04 DIAGNOSIS — H905 Unspecified sensorineural hearing loss: Secondary | ICD-10-CM | POA: Diagnosis not present

## 2018-04-04 DIAGNOSIS — F329 Major depressive disorder, single episode, unspecified: Secondary | ICD-10-CM | POA: Diagnosis not present

## 2018-04-04 DIAGNOSIS — Z79899 Other long term (current) drug therapy: Secondary | ICD-10-CM | POA: Insufficient documentation

## 2018-04-04 NOTE — Consult Note (Signed)
NEW PATIENT EVALUATION  Name: Adam Boyd  MRN: [id number]  Date:   04/04/2018     DOB: September 07, 1956   This 62 y.o. 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  REFERRING PHYSICIAN: [name], MD  CHIEF COMPLAINT:  Chief Complaint  Patient presents with  . Rectal Cancer    Initial Eval    DIAGNOSIS: The encounter diagnosis was Malignant neoplasm of rectum ([place]).   PREVIOUS INVESTIGATIONS:  CT scans reviewed Pathology report reviewed Clinical notes reviewed  HPI: patient is a 62 year old male who presented on routine screening colonoscopy to have a rectal polyp and had endoscopic removal showing invasive moderate to poorly differentiated adenocarcinoma involving the cauterized resection margin. The adenocarcinoma was arising a tubovillous adenoma with high-grade dysplasia. P.m. Asked to MS H2 and MS H6 were all expressed.Patient underwent EUSalthough I do not have those records for my review this showed this to be a T3 lesion. Patient is been seen by medical oncology recommendation for oral Xeloda with radiation therapy prior to surgical resection was made. Patient is doing well. He specifically denies abdominal pain diarrheaor blood per rectum. He is seen today for radiation oncology opinion.  PLANNED TREATMENT REGIMEN: concurrent oral Xeloda with pelvic radiation  PAST MEDICAL HISTORY:  has a past medical history of Arthritis, Asymmetrical sensorineural hearing loss (2012), Depression, History of chicken pox, UTI (urinary tract infection), and Smoker.    PAST SURGICAL HISTORY:  Past Surgical History:  Procedure Laterality Date  . CATARACT EXTRACTION Bilateral 2010  . 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  scattere small foci of hyperintensity (sequela or chronic small vessel ischemia vs demyelinating disorder, mild fluid R  mastoid air cells, paranasal sinus disease    FAMILY HISTORY: family history includes CAD (age of onset: 2) in his paternal grandfather; Cancer in his maternal uncle; Cancer (age of onset: 73) in his mother; Diabetes in his mother; Hypertension in his mother; Stroke in his paternal grandmother.  SOCIAL HISTORY:  reports that he has been smoking cigarettes.  He has a 42.00 pack-year smoking history. He has never used smokeless tobacco. He reports that he drinks alcohol. He reports that he does not use drugs.  ALLERGIES: Patient has no known allergies.  MEDICATIONS:  Current Outpatient Medications  Medication Sig Dispense Refill  . donepezil (ARICEPT) 5 MG tablet Take by mouth.    . memantine (NAMENDA) 10 MG tablet Take by mouth.    . methocarbamol (ROBAXIN) 750 MG tablet Take by mouth.    . Multiple Vitamin (MULTI-VITAMINS) TABS Take by mouth.    . naproxen (NAPROSYN) 500 MG tablet Take by mouth.     No current facility-administered medications for this encounter.     ECOG PERFORMANCE STATUS:  0 - Asymptomatic  REVIEW OF SYSTEMS: patient has mild dementia Patient denies any weight loss, fatigue, weakness, fever, chills or night sweats. Patient denies any loss of vision, blurred vision. Patient denies any ringing  of the ears or hearing loss. No irregular heartbeat. Patient denies heart murmur or history of fainting. Patient denies any chest pain or pain radiating to her upper extremities. Patient denies any shortness of breath, difficulty breathing at night, cough or hemoptysis. Patient denies any swelling in the lower legs. Patient denies any nausea vomiting, vomiting of blood, or coffee ground material in  the vomitus. Patient denies any stomach pain. Patient states has had normal bowel movements no significant constipation or diarrhea. Patient denies any dysuria, hematuria or significant nocturia. Patient denies any problems walking, swelling in the joints or loss of balance. Patient denies  any skin changes, loss of hair or loss of weight. Patient denies any excessive worrying or anxiety or significant depression. Patient denies any problems with insomnia. Patient denies excessive thirst, polyuria, polydipsia. Patient denies any swollen glands, patient denies easy bruising or easy bleeding. Patient denies any recent infections, allergies or URI. Patient "s visual fields have not changed significantly in recent time.    PHYSICAL EXAM: BP 133/75   Pulse 63   Temp 97.8 F (36.6 C)   Ht 5\' 6"  (1.676 m)   Wt 151 lb 3.8 oz (68.6 kg)   BMI 24.41 kg/m  Well-developed well-nourished patient in NAD. HEENT reveals PERLA, EOMI, discs not visualized.  Oral cavity is clear. No oral mucosal lesions are identified. Neck is clear without evidence of cervical or supraclavicular adenopathy. Lungs are clear to A&P. Cardiac examination is essentially unremarkable with regular rate and rhythm without murmur rub or thrill. Abdomen is benign with no organomegaly or masses noted. Motor sensory and DTR levels are equal and symmetric in the upper and lower extremities. Cranial nerves II through XII are grossly intact. Proprioception is intact. No peripheral adenopathy or edema is identified. No motor or sensory levels are noted. Crude visual fields are within normal range.  LABORATORY DATA: athology reports reviewed    RADIOLOGY RESULTS:CT scans disc has been requested for my review   IMPRESSION: T3 N0 M0 poorly differentiated adenocarcinoma the rectum for preop neoadjuvant chemotherapy and radiation in 62 year old male  PLAN: at this time I will review his CT scan as well as an endoscopic ultrasound report. Patient seen medical oncology and oral Xeloda has been ordered. Do not have surgeon's no we'll try to track that down as he has seen [place] colorectal surgeon at [facility]. Risks and benefits of pelvic radiation therapy including increased diarrhea fatigue alteration of blood counts possible increased  lower urinary tract symptoms all were discussed in detail with the patient and his sister. I first set up and ordered CT simulation for early next week.There will be extra effort by both professional staff as well as technical staff to coordinate and manage concurrent chemoradiation and ensuing side effects during his treatments. Patient and sister both seem to comprehend my treatment plan well.  I would like to take this opportunity to thank you for allowing me to participate in the care of your patient.Noreene Filbert, MD

## 2018-04-09 ENCOUNTER — Ambulatory Visit
Admission: RE | Admit: 2018-04-09 | Discharge: 2018-04-09 | Disposition: A | Discharge status: Home / Self Care | Payer: No Typology Code available for payment source | Source: Ambulatory Visit | Attending: Radiation Oncology | Admitting: Radiation Oncology

## 2018-04-09 DIAGNOSIS — C20 Malignant neoplasm of rectum: Secondary | ICD-10-CM | POA: Insufficient documentation

## 2018-04-09 DIAGNOSIS — Z510 Encounter for antineoplastic radiation therapy: Secondary | ICD-10-CM | POA: Diagnosis present

## 2018-04-10 DIAGNOSIS — Z510 Encounter for antineoplastic radiation therapy: Secondary | ICD-10-CM | POA: Diagnosis not present

## 2018-04-19 ENCOUNTER — Other Ambulatory Visit: Payer: Self-pay | Admitting: *Deleted

## 2018-04-19 DIAGNOSIS — C20 Malignant neoplasm of rectum: Secondary | ICD-10-CM

## 2018-04-22 ENCOUNTER — Ambulatory Visit
Admission: RE | Admit: 2018-04-22 | Discharge: 2018-04-22 | Disposition: A | Discharge status: Home / Self Care | Payer: No Typology Code available for payment source | Source: Ambulatory Visit | Attending: Radiation Oncology | Admitting: Radiation Oncology

## 2018-04-22 DIAGNOSIS — Z510 Encounter for antineoplastic radiation therapy: Secondary | ICD-10-CM | POA: Insufficient documentation

## 2018-04-22 DIAGNOSIS — C20 Malignant neoplasm of rectum: Secondary | ICD-10-CM | POA: Diagnosis not present

## 2018-04-23 ENCOUNTER — Ambulatory Visit
Admission: RE | Admit: 2018-04-23 | Discharge: 2018-04-23 | Disposition: A | Discharge status: Home / Self Care | Payer: No Typology Code available for payment source | Source: Ambulatory Visit | Attending: Radiation Oncology | Admitting: Radiation Oncology

## 2018-04-23 DIAGNOSIS — Z510 Encounter for antineoplastic radiation therapy: Secondary | ICD-10-CM | POA: Diagnosis not present

## 2018-04-24 ENCOUNTER — Ambulatory Visit
Admission: RE | Admit: 2018-04-24 | Discharge: 2018-04-24 | Disposition: A | Discharge status: Home / Self Care | Payer: No Typology Code available for payment source | Source: Ambulatory Visit | Attending: Radiation Oncology | Admitting: Radiation Oncology

## 2018-04-24 DIAGNOSIS — Z510 Encounter for antineoplastic radiation therapy: Secondary | ICD-10-CM | POA: Diagnosis not present

## 2018-04-25 ENCOUNTER — Ambulatory Visit
Admission: RE | Admit: 2018-04-25 | Discharge: 2018-04-25 | Disposition: A | Discharge status: Home / Self Care | Payer: No Typology Code available for payment source | Source: Ambulatory Visit | Attending: Radiation Oncology | Admitting: Radiation Oncology

## 2018-04-25 DIAGNOSIS — Z510 Encounter for antineoplastic radiation therapy: Secondary | ICD-10-CM | POA: Diagnosis not present

## 2018-04-26 ENCOUNTER — Ambulatory Visit
Admission: RE | Admit: 2018-04-26 | Discharge: 2018-04-26 | Disposition: A | Discharge status: Home / Self Care | Payer: No Typology Code available for payment source | Source: Ambulatory Visit | Attending: Radiation Oncology | Admitting: Radiation Oncology

## 2018-04-26 DIAGNOSIS — Z510 Encounter for antineoplastic radiation therapy: Secondary | ICD-10-CM | POA: Diagnosis not present

## 2018-04-28 ENCOUNTER — Ambulatory Visit: Admission: RE | Admit: 2018-04-28 | Payer: No Typology Code available for payment source | Source: Ambulatory Visit

## 2018-04-29 ENCOUNTER — Ambulatory Visit: Payer: No Typology Code available for payment source

## 2018-04-29 ENCOUNTER — Ambulatory Visit
Admission: RE | Admit: 2018-04-29 | Discharge: 2018-04-29 | Disposition: A | Discharge status: Home / Self Care | Payer: No Typology Code available for payment source | Source: Ambulatory Visit | Attending: Radiation Oncology | Admitting: Radiation Oncology

## 2018-04-29 DIAGNOSIS — Z510 Encounter for antineoplastic radiation therapy: Secondary | ICD-10-CM | POA: Diagnosis not present

## 2018-04-30 ENCOUNTER — Ambulatory Visit
Admission: RE | Admit: 2018-04-30 | Discharge: 2018-04-30 | Disposition: A | Discharge status: Home / Self Care | Payer: No Typology Code available for payment source | Source: Ambulatory Visit | Attending: Radiation Oncology | Admitting: Radiation Oncology

## 2018-04-30 DIAGNOSIS — Z510 Encounter for antineoplastic radiation therapy: Secondary | ICD-10-CM | POA: Diagnosis not present

## 2018-05-01 ENCOUNTER — Ambulatory Visit
Admission: RE | Admit: 2018-05-01 | Discharge: 2018-05-01 | Disposition: A | Discharge status: Home / Self Care | Payer: No Typology Code available for payment source | Source: Ambulatory Visit | Attending: Radiation Oncology | Admitting: Radiation Oncology

## 2018-05-01 DIAGNOSIS — Z510 Encounter for antineoplastic radiation therapy: Secondary | ICD-10-CM | POA: Diagnosis not present

## 2018-05-02 ENCOUNTER — Inpatient Hospital Stay: Payer: Non-veteran care | Attending: Radiation Oncology

## 2018-05-02 ENCOUNTER — Ambulatory Visit
Admission: RE | Admit: 2018-05-02 | Discharge: 2018-05-02 | Disposition: A | Discharge status: Home / Self Care | Payer: No Typology Code available for payment source | Source: Ambulatory Visit | Attending: Radiation Oncology | Admitting: Radiation Oncology

## 2018-05-02 ENCOUNTER — Other Ambulatory Visit: Payer: Self-pay

## 2018-05-02 DIAGNOSIS — C20 Malignant neoplasm of rectum: Secondary | ICD-10-CM | POA: Diagnosis present

## 2018-05-02 DIAGNOSIS — Z510 Encounter for antineoplastic radiation therapy: Secondary | ICD-10-CM | POA: Diagnosis not present

## 2018-05-02 LAB — CBC
HEMATOCRIT: 40.2 % (ref 40.0–52.0)
HEMOGLOBIN: 13.8 g/dL (ref 13.0–18.0)
MCH: 32.6 pg (ref 26.0–34.0)
MCHC: 34.4 g/dL (ref 32.0–36.0)
MCV: 94.6 fL (ref 80.0–100.0)
Platelets: 285 10*3/uL (ref 150–440)
RBC: 4.25 MIL/uL — AB (ref 4.40–5.90)
RDW: 14 % (ref 11.5–14.5)
WBC: 3.8 10*3/uL (ref 3.8–10.6)

## 2018-05-03 ENCOUNTER — Ambulatory Visit
Admission: RE | Admit: 2018-05-03 | Discharge: 2018-05-03 | Disposition: A | Discharge status: Home / Self Care | Payer: No Typology Code available for payment source | Source: Ambulatory Visit | Attending: Radiation Oncology | Admitting: Radiation Oncology

## 2018-05-03 DIAGNOSIS — Z510 Encounter for antineoplastic radiation therapy: Secondary | ICD-10-CM | POA: Diagnosis not present

## 2018-05-06 ENCOUNTER — Ambulatory Visit
Admission: RE | Admit: 2018-05-06 | Discharge: 2018-05-06 | Disposition: A | Discharge status: Home / Self Care | Payer: No Typology Code available for payment source | Source: Ambulatory Visit | Attending: Radiation Oncology | Admitting: Radiation Oncology

## 2018-05-06 DIAGNOSIS — Z510 Encounter for antineoplastic radiation therapy: Secondary | ICD-10-CM | POA: Diagnosis not present

## 2018-05-07 ENCOUNTER — Ambulatory Visit
Admission: RE | Admit: 2018-05-07 | Discharge: 2018-05-07 | Disposition: A | Discharge status: Home / Self Care | Payer: No Typology Code available for payment source | Source: Ambulatory Visit | Attending: Radiation Oncology | Admitting: Radiation Oncology

## 2018-05-07 DIAGNOSIS — Z510 Encounter for antineoplastic radiation therapy: Secondary | ICD-10-CM | POA: Diagnosis not present

## 2018-05-08 ENCOUNTER — Ambulatory Visit
Admission: RE | Admit: 2018-05-08 | Discharge: 2018-05-08 | Disposition: A | Discharge status: Home / Self Care | Payer: No Typology Code available for payment source | Source: Ambulatory Visit | Attending: Radiation Oncology | Admitting: Radiation Oncology

## 2018-05-08 DIAGNOSIS — Z510 Encounter for antineoplastic radiation therapy: Secondary | ICD-10-CM | POA: Diagnosis not present

## 2018-05-09 ENCOUNTER — Inpatient Hospital Stay: Payer: Non-veteran care

## 2018-05-09 ENCOUNTER — Ambulatory Visit
Admission: RE | Admit: 2018-05-09 | Discharge: 2018-05-09 | Disposition: A | Discharge status: Home / Self Care | Payer: No Typology Code available for payment source | Source: Ambulatory Visit | Attending: Radiation Oncology | Admitting: Radiation Oncology

## 2018-05-09 DIAGNOSIS — Z510 Encounter for antineoplastic radiation therapy: Secondary | ICD-10-CM | POA: Diagnosis not present

## 2018-05-10 ENCOUNTER — Ambulatory Visit
Admission: RE | Admit: 2018-05-10 | Discharge: 2018-05-10 | Disposition: A | Discharge status: Home / Self Care | Payer: No Typology Code available for payment source | Source: Ambulatory Visit | Attending: Radiation Oncology | Admitting: Radiation Oncology

## 2018-05-10 DIAGNOSIS — Z510 Encounter for antineoplastic radiation therapy: Secondary | ICD-10-CM | POA: Diagnosis not present

## 2018-05-14 ENCOUNTER — Ambulatory Visit
Admission: RE | Admit: 2018-05-14 | Discharge: 2018-05-14 | Disposition: A | Discharge status: Home / Self Care | Payer: No Typology Code available for payment source | Source: Ambulatory Visit | Attending: Radiation Oncology | Admitting: Radiation Oncology

## 2018-05-14 DIAGNOSIS — Z510 Encounter for antineoplastic radiation therapy: Secondary | ICD-10-CM | POA: Diagnosis not present

## 2018-05-14 DIAGNOSIS — C20 Malignant neoplasm of rectum: Secondary | ICD-10-CM | POA: Diagnosis not present

## 2018-05-15 ENCOUNTER — Ambulatory Visit
Admission: RE | Admit: 2018-05-15 | Discharge: 2018-05-15 | Disposition: A | Discharge status: Home / Self Care | Payer: No Typology Code available for payment source | Source: Ambulatory Visit | Attending: Radiation Oncology | Admitting: Radiation Oncology

## 2018-05-15 DIAGNOSIS — Z510 Encounter for antineoplastic radiation therapy: Secondary | ICD-10-CM | POA: Diagnosis not present

## 2018-05-16 ENCOUNTER — Inpatient Hospital Stay: Payer: Non-veteran care | Attending: Radiation Oncology

## 2018-05-16 ENCOUNTER — Ambulatory Visit
Admission: RE | Admit: 2018-05-16 | Discharge: 2018-05-16 | Disposition: A | Discharge status: Home / Self Care | Payer: No Typology Code available for payment source | Source: Ambulatory Visit | Attending: Radiation Oncology | Admitting: Radiation Oncology

## 2018-05-16 ENCOUNTER — Other Ambulatory Visit: Payer: Self-pay

## 2018-05-16 DIAGNOSIS — Z510 Encounter for antineoplastic radiation therapy: Secondary | ICD-10-CM | POA: Diagnosis not present

## 2018-05-16 DIAGNOSIS — C20 Malignant neoplasm of rectum: Secondary | ICD-10-CM | POA: Diagnosis present

## 2018-05-16 LAB — CBC
HEMATOCRIT: 37.4 % — AB (ref 40.0–52.0)
HEMOGLOBIN: 12.8 g/dL — AB (ref 13.0–18.0)
MCH: 32.6 pg (ref 26.0–34.0)
MCHC: 34.3 g/dL (ref 32.0–36.0)
MCV: 95 fL (ref 80.0–100.0)
Platelets: 313 10*3/uL (ref 150–440)
RBC: 3.94 MIL/uL — AB (ref 4.40–5.90)
RDW: 14.1 % (ref 11.5–14.5)
WBC: 5.3 10*3/uL (ref 3.8–10.6)

## 2018-05-17 ENCOUNTER — Ambulatory Visit
Admission: RE | Admit: 2018-05-17 | Discharge: 2018-05-17 | Disposition: A | Discharge status: Home / Self Care | Payer: No Typology Code available for payment source | Source: Ambulatory Visit | Attending: Radiation Oncology | Admitting: Radiation Oncology

## 2018-05-17 DIAGNOSIS — Z510 Encounter for antineoplastic radiation therapy: Secondary | ICD-10-CM | POA: Diagnosis not present

## 2018-05-20 ENCOUNTER — Ambulatory Visit
Admission: RE | Admit: 2018-05-20 | Discharge: 2018-05-20 | Disposition: A | Discharge status: Home / Self Care | Payer: No Typology Code available for payment source | Source: Ambulatory Visit | Attending: Radiation Oncology | Admitting: Radiation Oncology

## 2018-05-20 DIAGNOSIS — Z510 Encounter for antineoplastic radiation therapy: Secondary | ICD-10-CM | POA: Diagnosis not present

## 2018-05-21 ENCOUNTER — Ambulatory Visit
Admission: RE | Admit: 2018-05-21 | Discharge: 2018-05-21 | Disposition: A | Discharge status: Home / Self Care | Payer: No Typology Code available for payment source | Source: Ambulatory Visit | Attending: Radiation Oncology | Admitting: Radiation Oncology

## 2018-05-21 DIAGNOSIS — Z510 Encounter for antineoplastic radiation therapy: Secondary | ICD-10-CM | POA: Diagnosis not present

## 2018-05-22 ENCOUNTER — Ambulatory Visit
Admission: RE | Admit: 2018-05-22 | Discharge: 2018-05-22 | Disposition: A | Discharge status: Home / Self Care | Payer: No Typology Code available for payment source | Source: Ambulatory Visit | Attending: Radiation Oncology | Admitting: Radiation Oncology

## 2018-05-22 DIAGNOSIS — Z510 Encounter for antineoplastic radiation therapy: Secondary | ICD-10-CM | POA: Diagnosis not present

## 2018-05-23 ENCOUNTER — Ambulatory Visit
Admission: RE | Admit: 2018-05-23 | Discharge: 2018-05-23 | Disposition: A | Discharge status: Home / Self Care | Payer: No Typology Code available for payment source | Source: Ambulatory Visit | Attending: Radiation Oncology | Admitting: Radiation Oncology

## 2018-05-23 ENCOUNTER — Inpatient Hospital Stay: Payer: Non-veteran care

## 2018-05-23 DIAGNOSIS — Z510 Encounter for antineoplastic radiation therapy: Secondary | ICD-10-CM | POA: Diagnosis not present

## 2018-05-24 ENCOUNTER — Ambulatory Visit
Admission: RE | Admit: 2018-05-24 | Discharge: 2018-05-24 | Disposition: A | Discharge status: Home / Self Care | Payer: No Typology Code available for payment source | Source: Ambulatory Visit | Attending: Radiation Oncology | Admitting: Radiation Oncology

## 2018-05-24 DIAGNOSIS — Z510 Encounter for antineoplastic radiation therapy: Secondary | ICD-10-CM | POA: Diagnosis not present

## 2018-05-27 ENCOUNTER — Ambulatory Visit
Admission: RE | Admit: 2018-05-27 | Discharge: 2018-05-27 | Disposition: A | Discharge status: Home / Self Care | Payer: No Typology Code available for payment source | Source: Ambulatory Visit | Attending: Radiation Oncology | Admitting: Radiation Oncology

## 2018-05-27 DIAGNOSIS — Z510 Encounter for antineoplastic radiation therapy: Secondary | ICD-10-CM | POA: Diagnosis not present

## 2018-05-28 ENCOUNTER — Ambulatory Visit
Admission: RE | Admit: 2018-05-28 | Discharge: 2018-05-28 | Disposition: A | Discharge status: Home / Self Care | Payer: No Typology Code available for payment source | Source: Ambulatory Visit | Attending: Radiation Oncology | Admitting: Radiation Oncology

## 2018-05-28 DIAGNOSIS — Z510 Encounter for antineoplastic radiation therapy: Secondary | ICD-10-CM | POA: Diagnosis not present

## 2018-05-29 ENCOUNTER — Ambulatory Visit
Admission: RE | Admit: 2018-05-29 | Discharge: 2018-05-29 | Disposition: A | Discharge status: Home / Self Care | Payer: No Typology Code available for payment source | Source: Ambulatory Visit | Attending: Radiation Oncology | Admitting: Radiation Oncology

## 2018-05-29 DIAGNOSIS — Z510 Encounter for antineoplastic radiation therapy: Secondary | ICD-10-CM | POA: Diagnosis not present

## 2018-05-30 ENCOUNTER — Inpatient Hospital Stay: Payer: Non-veteran care

## 2018-05-30 ENCOUNTER — Ambulatory Visit
Admission: RE | Admit: 2018-05-30 | Discharge: 2018-05-30 | Disposition: A | Discharge status: Home / Self Care | Payer: No Typology Code available for payment source | Source: Ambulatory Visit | Attending: Radiation Oncology | Admitting: Radiation Oncology

## 2018-05-30 DIAGNOSIS — Z510 Encounter for antineoplastic radiation therapy: Secondary | ICD-10-CM | POA: Diagnosis not present

## 2018-05-31 ENCOUNTER — Ambulatory Visit
Admission: RE | Admit: 2018-05-31 | Discharge: 2018-05-31 | Disposition: A | Discharge status: Home / Self Care | Payer: No Typology Code available for payment source | Source: Ambulatory Visit | Attending: Radiation Oncology | Admitting: Radiation Oncology

## 2018-05-31 DIAGNOSIS — Z510 Encounter for antineoplastic radiation therapy: Secondary | ICD-10-CM | POA: Diagnosis not present

## 2018-07-04 ENCOUNTER — Other Ambulatory Visit: Payer: Self-pay

## 2018-07-04 ENCOUNTER — Ambulatory Visit
Admission: RE | Admit: 2018-07-04 | Discharge: 2018-07-04 | Disposition: A | Discharge status: Home / Self Care | Payer: No Typology Code available for payment source | Source: Ambulatory Visit | Attending: Radiation Oncology | Admitting: Radiation Oncology

## 2018-07-04 ENCOUNTER — Encounter: Payer: Self-pay | Admitting: Radiation Oncology

## 2018-07-04 VITALS — BP 131/80 | HR 60 | Resp 18 | Wt 152.4 lb

## 2018-07-04 DIAGNOSIS — C20 Malignant neoplasm of rectum: Secondary | ICD-10-CM | POA: Diagnosis present

## 2018-07-04 NOTE — Progress Notes (Signed)
Radiation Oncology Follow up Note  Name: Adam Boyd   Date:   07/04/2018 MRN:  [id number] DOB: 08-08-1956    This 62 y.o. 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  REFERRING PROVIDER: [name], MD  HPI: patient is a 62 year old male who was completed concurrent chemoradiation in a neoadjuvant fashion prior to surgical resection of a stage IIa adenocarcinoma the rectum. He is seen today in routine follow-up and is doing well. His bowel function has normalized. He is having no significant diarrhea or blood per rectum..he recently had an MRI scan at [facility] where he is planning the surgical resection showing wall thickening along the right posterior lateral aspect of the lower rectum representing residual primary tumor and a 1.7 cm measle rectal lymph nodelikely reflecting posttreatment change. He is scheduled for surgical resection in the next 30 days.  COMPLICATIONS OF TREATMENT: none  FOLLOW UP COMPLIANCE: keeps appointments   PHYSICAL EXAM:  BP 131/80 (BP Location: Right Arm, Patient Position: Sitting)   Pulse 60   Resp 18   Wt 152 lb 7.2 oz (69.2 kg)   BMI 24.61 kg/m  Well-developed well-nourished patient in NAD. HEENT reveals PERLA, EOMI, discs not visualized.  Oral cavity is clear. No oral mucosal lesions are identified. Neck is clear without evidence of cervical or supraclavicular adenopathy. Lungs are clear to A&P. Cardiac examination is essentially unremarkable with regular rate and rhythm without murmur rub or thrill. Abdomen is benign with no organomegaly or masses noted. Motor sensory and DTR levels are equal and symmetric in the upper and lower extremities. Cranial nerves II through XII are grossly intact. Proprioception is intact. No peripheral adenopathy or edema is identified. No motor or sensory levels are noted. Crude  visual fields are within normal range.  RADIOLOGY RESULTS: MRI report is reviewed  PLAN: present time patient is doing well recovering nicely from his concurrent chemoradiation. I've asked to see him back in approximately 3-4 months for follow-up at which time we'll have his pathology final report to review. Patient may benefit from additional chemotherapy at that time. He continues close follow-up care with [facility] medical oncology and [place]. Patient is to call with any concerns.  I would like to take this opportunity to thank you for allowing me to participate in the care of your patient.Noreene Filbert, MD

## 2018-08-09 ENCOUNTER — Encounter: Payer: Self-pay | Admitting: Emergency Medicine

## 2018-08-09 ENCOUNTER — Emergency Department
Admission: EM | Admit: 2018-08-09 | Discharge: 2018-08-09 | Disposition: A | Discharge status: Other Acute Inpatient Hospital | Payer: Medicaid Other | Attending: Emergency Medicine | Admitting: Emergency Medicine

## 2018-08-09 ENCOUNTER — Emergency Department: Payer: Medicaid Other

## 2018-08-09 ENCOUNTER — Other Ambulatory Visit: Payer: Self-pay

## 2018-08-09 DIAGNOSIS — Z79899 Other long term (current) drug therapy: Secondary | ICD-10-CM | POA: Insufficient documentation

## 2018-08-09 DIAGNOSIS — F17210 Nicotine dependence, cigarettes, uncomplicated: Secondary | ICD-10-CM | POA: Insufficient documentation

## 2018-08-09 DIAGNOSIS — R4182 Altered mental status, unspecified: Secondary | ICD-10-CM | POA: Insufficient documentation

## 2018-08-09 DIAGNOSIS — K631 Perforation of intestine (nontraumatic): Secondary | ICD-10-CM | POA: Diagnosis not present

## 2018-08-09 DIAGNOSIS — K56609 Unspecified intestinal obstruction, unspecified as to partial versus complete obstruction: Secondary | ICD-10-CM | POA: Diagnosis not present

## 2018-08-09 DIAGNOSIS — J449 Chronic obstructive pulmonary disease, unspecified: Secondary | ICD-10-CM | POA: Diagnosis not present

## 2018-08-09 HISTORY — DX: Malignant (primary) neoplasm, unspecified: C80.1

## 2018-08-09 LAB — CBC WITH DIFFERENTIAL/PLATELET
Abs Immature Granulocytes: 0.77 10*3/uL — ABNORMAL HIGH (ref 0.00–0.07)
BASOS PCT: 1 %
Basophils Absolute: 0.1 10*3/uL (ref 0.0–0.1)
Eosinophils Absolute: 0 10*3/uL (ref 0.0–0.5)
Eosinophils Relative: 0 %
HCT: 36.5 % — ABNORMAL LOW (ref 39.0–52.0)
Hemoglobin: 12.3 g/dL — ABNORMAL LOW (ref 13.0–17.0)
Immature Granulocytes: 4 %
Lymphocytes Relative: 4 %
Lymphs Abs: 0.7 10*3/uL (ref 0.7–4.0)
MCH: 31.5 pg (ref 26.0–34.0)
MCHC: 33.7 g/dL (ref 30.0–36.0)
MCV: 93.4 fL (ref 80.0–100.0)
MONO ABS: 1.4 10*3/uL — AB (ref 0.1–1.0)
MONOS PCT: 8 %
Neutro Abs: 14.4 10*3/uL — ABNORMAL HIGH (ref 1.7–7.7)
Neutrophils Relative %: 83 %
PLATELETS: 872 10*3/uL — AB (ref 150–400)
RBC: 3.91 MIL/uL — ABNORMAL LOW (ref 4.22–5.81)
RDW: 13.2 % (ref 11.5–15.5)
WBC: 17.4 10*3/uL — ABNORMAL HIGH (ref 4.0–10.5)
nRBC: 0.1 % (ref 0.0–0.2)

## 2018-08-09 LAB — COMPREHENSIVE METABOLIC PANEL
ALBUMIN: 2.9 g/dL — AB (ref 3.5–5.0)
ALK PHOS: 117 U/L (ref 38–126)
ALT: 24 U/L (ref 0–44)
AST: 32 U/L (ref 15–41)
Anion gap: 11 (ref 5–15)
BILIRUBIN TOTAL: 0.8 mg/dL (ref 0.3–1.2)
BUN: 41 mg/dL — ABNORMAL HIGH (ref 8–23)
CALCIUM: 8.6 mg/dL — AB (ref 8.9–10.3)
CO2: 26 mmol/L (ref 22–32)
CREATININE: 1.36 mg/dL — AB (ref 0.61–1.24)
Chloride: 90 mmol/L — ABNORMAL LOW (ref 98–111)
GFR calc Af Amer: >60 mL/min (ref >60)
GFR calc non Af Amer: 55 mL/min — ABNORMAL LOW (ref >60)
GLUCOSE: 171 mg/dL — AB (ref 70–99)
Potassium: 4.6 mmol/L (ref 3.5–5.1)
Sodium: 127 mmol/L — ABNORMAL LOW (ref 135–145)
TOTAL PROTEIN: 8.6 g/dL — AB (ref 6.5–8.1)

## 2018-08-09 LAB — CG4 I-STAT (LACTIC ACID): Lactic Acid, Venous: 1.22 mmol/L (ref 0.5–1.9)

## 2018-08-09 MED ORDER — SODIUM CHLORIDE 0.9 % IV SOLN: 1.0000 g | Freq: Once | INTRAVENOUS | Status: DC

## 2018-08-09 MED ORDER — IOPAMIDOL (ISOVUE-300) INJECTION 61%
30.0000 mL | Freq: Once | INTRAVENOUS | Status: AC | PRN
  Administered 2018-08-09: 30 mL via ORAL

## 2018-08-09 MED ORDER — SODIUM CHLORIDE 0.9 % IV BOLUS
1000.0000 mL | Freq: Once | INTRAVENOUS | Status: AC
  Administered 2018-08-09: 1000 mL via INTRAVENOUS

## 2018-08-09 MED ORDER — IOPAMIDOL (ISOVUE-300) INJECTION 61%
100.0000 mL | Freq: Once | INTRAVENOUS | Status: AC | PRN
  Administered 2018-08-09: 100 mL via INTRAVENOUS

## 2018-08-09 MED ORDER — PIPERACILLIN-TAZOBACTAM 3.375 G IVPB 30 MIN
3.3750 g | Freq: Once | INTRAVENOUS | Status: AC
  Administered 2018-08-09: 3.375 g via INTRAVENOUS
  Filled 2018-08-09: qty 50

## 2018-08-09 MED ORDER — VANCOMYCIN HCL IN DEXTROSE 1-5 GM/200ML-% IV SOLN
1000.0000 mg | Freq: Once | INTRAVENOUS | Status: AC
  Administered 2018-08-09: 1000 mg via INTRAVENOUS
  Filled 2018-08-09: qty 200

## 2018-08-09 NOTE — Consult Note (Signed)
CODE SEPSIS - PHARMACY COMMUNICATION  **Broad Spectrum Antibiotics should be administered within 1 hour of Sepsis diagnosis**  Time Code Sepsis Called/Page Received: 7915   Additional action taken by pharmacy: I spoke with Annie Main, RN and Dr Cinda Quest and the code sepsis is being cancelled for now   Dallie Piles ,PharmD Clinical Pharmacist  08/09/2018  6:07 PM

## 2018-08-09 NOTE — ED Provider Notes (Signed)
[facility] Emergency Department Provider Note   ____________________________________________   First MD Initiated Contact with Patient 08/09/18 1730     (approximate)  I have reviewed the triage vital signs and the nursing notes.   HISTORY  Chief Complaint Altered Mental Status    HPI Adam Boyd is a 62 y.o. male with a history of dementia who recently had a rectal surgery done at [facility] for rectal cancer with placement of an ostomy.  Patient has not been eating or drinking much at all since he came back from the hospital.  Patient has been increasingly altered mental status especially for the last 2 days and temperature of orally today after having Tylenol and drinking some cold fluid.  Patient says he feels well and does not know why he is here.  Past Medical History:  Diagnosis Date  . Arthritis    bad back, pinched nerve  . Asymmetrical sensorineural hearing loss 2012   Right ear (odd appearance), s/p eval by ENT and MRI, never followed up.  will recommend pt f/u.  Marland Kitchen Cancer ([place])   . Depression   . History of chicken pox   . Hx: UTI (urinary tract infection)   . Smoker     Patient Active Problem List   Diagnosis Date Noted  . Does not have health insurance 09/01/2017  . Alcohol use 09/01/2017  . Thrombocytosis ([place]) 08/31/2017  . Severe protein-calorie malnutrition ([place]) 08/31/2017  . Memory impairment 08/31/2017  . Empyema ([place]) 08/11/2017  . Back pain 09/15/2011  . Tick bite 03/12/2011  . Healthcare maintenance 03/12/2011  . Arthritis   . Smoker   . COPD (chronic obstructive pulmonary disease) ([place]) 03/10/2011    Past Surgical History:  Procedure Laterality Date  . CATARACT EXTRACTION Bilateral 2010  . 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  scattere small foci of hyperintensity (sequela or chronic small vessel ischemia vs demyelinating disorder,  mild fluid R mastoid air cells, paranasal sinus disease    Prior to Admission medications   Medication Sig Start Date End Date Taking? Authorizing Provider  donepezil (ARICEPT) 5 MG tablet Take by mouth.    [provider]  LEVOTHYROXINE SODIUM PO Take by mouth. Unknown dose    [provider]  memantine (NAMENDA) 10 MG tablet Take by mouth.    [provider]  methocarbamol (ROBAXIN) 750 MG tablet Take by mouth.    [provider]  Multiple Vitamin (MULTI-VITAMINS) TABS Take by mouth.    [provider]  naproxen (NAPROSYN) 500 MG tablet Take by mouth.    [provider]    Allergies Patient has no known allergies.  Family History  Problem Relation Age of Onset  . Hypertension Mother   . Diabetes Mother   . Cancer Mother 20       CLL  . Stroke Paternal Grandmother   . CAD Paternal Grandfather 73       MI?  Marland Kitchen Cancer Maternal Uncle        pancreatic  . Coronary artery disease Neg Hx     Social History Social History   Tobacco Use  . Smoking status: Current Every Day Smoker    Packs/day: 1.00    Years: 42.00    Pack years: 42.00    Types: Cigarettes  . Smokeless tobacco: Never Used  Substance Use Topics  .  Alcohol use: Yes    Comment: 2 beers/day  . Drug use: No    Review of Systems  Constitutional: No fever/chills Eyes: No visual changes. ENT: No sore throat. Cardiovascular: Denies chest pain. Respiratory: Denies shortness of breath. Gastrointestinal: No abdominal pain.  No nausea, no vomiting.  No diarrhea.  No constipation. Genitourinary: Negative for dysuria. Musculoskeletal: Negative for back pain. Skin: Negative for rash. Neurological: Negative for headaches, focal weakness   ____________________________________________   PHYSICAL EXAM:  VITAL SIGNS: ED Triage Vitals  Enc Vitals Group     BP 08/09/18 1730 133/83     Pulse Rate 08/09/18 1730 (!) 105     Resp 08/09/18 1730 (!) 25     Temp  08/09/18 1730 99.3 F (37.4 C)     Temp Source 08/09/18 1730 Oral     SpO2 08/09/18 1730 99 %     Weight 08/09/18 1731 152 lb 8.9 oz (69.2 kg)     Height --      Head Circumference --      Peak Flow --      Pain Score 08/09/18 1731 0     Pain Loc --      Pain Edu? --      Excl. in [place]? --     Constitutional: Alert  Well appearing and in no acute distress. Eyes: Conjunctivae are normal. Head: Atraumatic. Nose: No congestion/rhinnorhea. Mouth/Throat: Mucous membranes are moist.  Oropharynx non-erythematous. Neck: No stridor.  Cardiovascular: Normal rate, regular rhythm. Grossly normal heart sounds.  Good peripheral circulation. Respiratory: Normal respiratory effort.  No retractions. Lungs CTAB. Gastrointestinal: Soft and nontender. No distention. No abdominal bruits. No CVA tenderness. Musculoskeletal: No lower extremity tenderness nor edema.  Neurologic:  Normal speech and language. No gross focal neurologic deficits are appreciated.  Skin:  Skin is warm, dry and intact. No rash noted.   ____________________________________________   LABS (all labs ordered are listed, but only abnormal results are displayed)  Labs Reviewed  COMPREHENSIVE METABOLIC PANEL - Abnormal; Notable for the following components:      Result Value   Sodium 127 (*)    Chloride 90 (*)    Glucose, Bld 171 (*)    BUN 41 (*)    Creatinine, Ser 1.36 (*)    Calcium 8.6 (*)    Total Protein 8.6 (*)    Albumin 2.9 (*)    GFR calc non Af Amer 55 (*)    All other components within normal limits  CBC WITH DIFFERENTIAL/PLATELET - Abnormal; Notable for the following components:   WBC 17.4 (*)    RBC 3.91 (*)    Hemoglobin 12.3 (*)    HCT 36.5 (*)    Platelets 872 (*)    Neutro Abs 14.4 (*)    Monocytes Absolute 1.4 (*)    Abs Immature Granulocytes 0.77 (*)    All other components within normal limits  CULTURE, BLOOD (ROUTINE X 2)  CULTURE, BLOOD (ROUTINE X 2)  URINALYSIS, ROUTINE W REFLEX MICROSCOPIC    I-STAT CG4 LACTIC ACID, ED  CG4 I-STAT (LACTIC ACID)  I-STAT CG4 LACTIC ACID, ED   ____________________________________________  EKG  EKG read and interpreted by me shows sinus tachycardia rate 108 left axis PR segment depression in aVF otherwise no apparent acute changes ____________________________________________  RADIOLOGY  ED MD interpretation: CT read by radiology reviewed by me shows free fluid in the abdomen with some pneumoperitoneum and a bowel obstruction.  Official radiology report(s): Dg Chest 2 View  Result Date: 08/09/2018 CLINICAL DATA:  Altered mental status with low-grade fever. EXAM: CHEST - 2 VIEW COMPARISON:  09/14/2017 FINDINGS: The heart size and mediastinal contours are within normal limits. Both lungs are clear. The visualized skeletal structures are unremarkable. IMPRESSION: No active cardiopulmonary disease. Electronically Signed   By: Kerby Moors M.D.   On: 08/09/2018 18:01   Ct Abdomen Pelvis W Contrast  Result Date: 08/09/2018 CLINICAL DATA:  Loss of weight. Rectal cancer. Status post low anterior resection of rectum with diverting colostomy. EXAM: CT ABDOMEN AND PELVIS WITH CONTRAST TECHNIQUE: Multidetector CT imaging of the abdomen and pelvis was performed using the standard protocol following bolus administration of intravenous contrast. CONTRAST:  113mL ISOVUE-300 IOPAMIDOL (ISOVUE-300) INJECTION 61% COMPARISON:  CT chest 03/11/2018. FINDINGS: Lower chest: Multiple pulmonary nodules are identified within the lung bases. The largest is in the posterior right lower lobe measuring 1.2 cm, image 16/4. Previously 0.8 cm. New nodule in the posterior left lower lobe measures 5 mm, image 14/4. Lateral right lower lobe pulmonary nodule is also new measuring 3 mm, image 10/4. Hepatobiliary: There is no focal liver abnormality. Gallbladder appears collapsed. No biliary dilatation. Pancreas: Unremarkable. No pancreatic ductal dilatation or surrounding inflammatory  changes. Spleen: Normal in size without focal abnormality. Adrenals/Urinary Tract: Normal adrenal glands. Normal appearance of the kidneys. No mass or hydronephrosis. Gas noted within the urinary bladder. Stomach/Bowel: There is marked distension of the stomach. The small bowel loops are dilated with multiple air-fluid levels. These measure up to 4.3 cm. Transition to decreased caliber distal small bowel loops noted within the right lower quadrant of the abdomen, image 68/2. Distal small bowel is normal to decreased in caliber with bowel wall edema and inflammation up to the level of the double barrel ileostomy. Colon appears diffusely collapsed. Within the low pelvis there appears to be dehiscence of the anastomosis. There is there is a large fluid collection between the distal end of the colon and the suture line. This fluid collection contains debris and air-fluid level measuring 6.2 by 5.9 by 6.6 cm. Surrounding areas of extraluminal gas are identified within both sides of the pelvis. Vascular/Lymphatic: Aortic atherosclerosis. No aneurysm. No abdominal or pelvic adenopathy identified. Reproductive: Prostate is unremarkable. Other: There is a fluid collection which extends over the right lobe of liver containing gas, worrisome for abscess. The large is component extends along the inferior right hepatic lobe measuring 5.7 x 3.5 by 2.6 cm. A second fluid collection is identified within the right lower quadrant of the abdomen and partially obscures surrounds inflamed distal small bowel loops. This measures 5.5 x 1.9 by 6.0 cm. Within the right lower quadrant mesentery there is a fluid collection measuring 4.8 by 2.7 by 4.8 cm. Musculoskeletal: Spondylosis noted within the lumbar spine. 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 IMPRESSION: 1. The distal colon appears to have separated from the suture line within the lower pelvis. This is worrisome for distal anastomotic dehiscence. 2. Large fluid collection  containing air-fluid level and debris surrounds the dehiscence site worrisome for abscess. Three additional fluid collections are noted involving the right hepatic lobe, right lower quadrant small bowel loops and right lower quadrant mesentery. 3. Abnormal small bowel dilatation with transition point in the right lower quadrant of the abdomen. Findings may represent either small bowel obstruction or small-bowel ileus. 4. Pneumoperitoneum is identified. This may be either secondary to anastomotic dehiscence or residual postoperative changes. 5. Progression of pulmonary nodularity compared with 03/11/2018. Suspicious for metastatic disease.  Electronically Signed   By: Kerby Moors M.D.   On: 08/09/2018 19:58    ____________________________________________   PROCEDURES  Procedure(s) performed:   Procedures  Critical Care performed: Medical care time 1 hour including evaluating the patient discussing patient with our surgeon radiology and [facility].  ____________________________________________   INITIAL IMPRESSION / ASSESSMENT AND PLAN / ED COURSE  Our surgeon feels like the patient needs to go back to his surgeon of record which is very sensible.  We will give him Zosyn and Vanco at his suggestion our surgeons suggestion     Discussed with Dr. Clayton Bibles OTA nO PO U LOS who will accept him patient will go to the ER.  We will continue the antibiotics.   ____________________________________________   FINAL CLINICAL IMPRESSION(S) / ED DIAGNOSES  Final diagnoses:  Altered mental status, unspecified altered mental status type  Bowel perforation ([place])  Intestinal obstruction, unspecified cause, unspecified whether partial or complete [facility])     ED Discharge Orders    None       Note:  This document was prepared using Dragon voice recognition software and may include unintentional dictation errors.    Nena Polio, MD 08/09/18 2016

## 2018-08-09 NOTE — ED Notes (Signed)
ED Provider at bedside. 

## 2018-08-09 NOTE — ED Notes (Signed)
Patient transported to X-ray 

## 2018-08-09 NOTE — ED Triage Notes (Signed)
Pt to ED via [facility] from home for change in mental status. Pt has hx/o Dementia. Pt recently had rectal surgery and had colostomy bag placed due to rectal cancer. Pt sister reports that over the past 2 days pt has had change in mental status, sister also reports that pt is not eating or drinking much since the surgery. EMS reports that pt was tachycardic at 114, also reports pt was agitated. Pt is currently in NAD.

## 2018-08-14 LAB — CULTURE, BLOOD (ROUTINE X 2)
CULTURE: NO GROWTH
Culture: NO GROWTH
Special Requests: ADEQUATE

## 2018-08-28 ENCOUNTER — Other Ambulatory Visit: Payer: Self-pay

## 2018-08-28 ENCOUNTER — Emergency Department
Admission: EM | Admit: 2018-08-28 | Discharge: 2018-08-28 | Disposition: A | Discharge status: Skilled Nursing Facility | Payer: Medicaid Other | Attending: Emergency Medicine | Admitting: Emergency Medicine

## 2018-08-28 ENCOUNTER — Encounter: Payer: Self-pay | Admitting: Emergency Medicine

## 2018-08-28 ENCOUNTER — Emergency Department: Payer: Medicaid Other

## 2018-08-28 DIAGNOSIS — C20 Malignant neoplasm of rectum: Secondary | ICD-10-CM | POA: Diagnosis not present

## 2018-08-28 DIAGNOSIS — J449 Chronic obstructive pulmonary disease, unspecified: Secondary | ICD-10-CM | POA: Diagnosis not present

## 2018-08-28 DIAGNOSIS — Z79899 Other long term (current) drug therapy: Secondary | ICD-10-CM | POA: Diagnosis not present

## 2018-08-28 DIAGNOSIS — F329 Major depressive disorder, single episode, unspecified: Secondary | ICD-10-CM | POA: Insufficient documentation

## 2018-08-28 DIAGNOSIS — F17210 Nicotine dependence, cigarettes, uncomplicated: Secondary | ICD-10-CM | POA: Diagnosis not present

## 2018-08-28 DIAGNOSIS — R634 Abnormal weight loss: Secondary | ICD-10-CM | POA: Diagnosis present

## 2018-08-28 DIAGNOSIS — F0391 Unspecified dementia with behavioral disturbance: Secondary | ICD-10-CM | POA: Diagnosis not present

## 2018-08-28 LAB — CBC WITH DIFFERENTIAL/PLATELET
Abs Immature Granulocytes: 0.04 10*3/uL (ref 0.00–0.07)
Basophils Absolute: 0.1 10*3/uL (ref 0.0–0.1)
Basophils Relative: 1 %
EOS ABS: 0 10*3/uL (ref 0.0–0.5)
Eosinophils Relative: 0 %
HCT: 37 % — ABNORMAL LOW (ref 39.0–52.0)
Hemoglobin: 12 g/dL — ABNORMAL LOW (ref 13.0–17.0)
Immature Granulocytes: 0 %
Lymphocytes Relative: 8 %
Lymphs Abs: 0.8 10*3/uL (ref 0.7–4.0)
MCH: 30.4 pg (ref 26.0–34.0)
MCHC: 32.4 g/dL (ref 30.0–36.0)
MCV: 93.7 fL (ref 80.0–100.0)
Monocytes Absolute: 1 10*3/uL (ref 0.1–1.0)
Monocytes Relative: 10 %
Neutro Abs: 7.4 10*3/uL (ref 1.7–7.7)
Neutrophils Relative %: 81 %
Platelets: 807 10*3/uL — ABNORMAL HIGH (ref 150–400)
RBC: 3.95 MIL/uL — ABNORMAL LOW (ref 4.22–5.81)
RDW: 12.8 % (ref 11.5–15.5)
WBC: 9.2 10*3/uL (ref 4.0–10.5)
nRBC: 0 % (ref 0.0–0.2)

## 2018-08-28 LAB — COMPREHENSIVE METABOLIC PANEL
ALT: 41 U/L (ref 0–44)
AST: 42 U/L — ABNORMAL HIGH (ref 15–41)
Albumin: 2.8 g/dL — ABNORMAL LOW (ref 3.5–5.0)
Alkaline Phosphatase: 207 U/L — ABNORMAL HIGH (ref 38–126)
Anion gap: 12 (ref 5–15)
BILIRUBIN TOTAL: 0.7 mg/dL (ref 0.3–1.2)
BUN: 19 mg/dL (ref 8–23)
CO2: 20 mmol/L — ABNORMAL LOW (ref 22–32)
Calcium: 8.6 mg/dL — ABNORMAL LOW (ref 8.9–10.3)
Chloride: 97 mmol/L — ABNORMAL LOW (ref 98–111)
Creatinine, Ser: 1.12 mg/dL (ref 0.61–1.24)
Glucose, Bld: 109 mg/dL — ABNORMAL HIGH (ref 70–99)
Potassium: 4 mmol/L (ref 3.5–5.1)
Sodium: 129 mmol/L — ABNORMAL LOW (ref 135–145)
TOTAL PROTEIN: 7.8 g/dL (ref 6.5–8.1)

## 2018-08-28 LAB — LIPASE, BLOOD: LIPASE: 43 U/L (ref 11–51)

## 2018-08-28 MED ORDER — IOPAMIDOL (ISOVUE-300) INJECTION 61%
100.0000 mL | Freq: Once | INTRAVENOUS | Status: AC | PRN
  Administered 2018-08-28: 80 mL via INTRAVENOUS

## 2018-08-28 MED ORDER — ONDANSETRON HCL 4 MG/2ML IJ SOLN
4.0000 mg | Freq: Once | INTRAMUSCULAR | Status: AC
  Administered 2018-08-28: 4 mg via INTRAVENOUS
  Filled 2018-08-28: qty 2

## 2018-08-28 MED ORDER — IOPAMIDOL (ISOVUE-300) INJECTION 61%
30.0000 mL | Freq: Once | INTRAVENOUS | Status: AC | PRN
  Administered 2018-08-28: 30 mL via ORAL

## 2018-08-28 MED ORDER — SODIUM CHLORIDE 0.9 % IV BOLUS
1000.0000 mL | Freq: Once | INTRAVENOUS | Status: AC
  Administered 2018-08-28: 1000 mL via INTRAVENOUS

## 2018-08-28 NOTE — ED Provider Notes (Signed)
[facility] EMERGENCY DEPARTMENT Provider Note   CSN: [id number] Arrival date & time: 08/28/18  3710     History   Chief Complaint Chief Complaint  Patient presents with  . Dehydration    HPI Adam Boyd is a 62 y.o. male history of dementia, recent surgery with colostomy with complication of intra-abdominal abscess requiring IR drain here presenting with poor appetite, weight loss.  Patient was recently admitted for a surgical complication with intra-abdominal abscess and was transferred to [facility].  He subsequently had an IR drain that was placed and was removed several days ago.  Patient has been home from the hospital for about a week.  Patient states that he has no appetite and just does not want to eat and drink much.  Patient apparently lost about 40 pounds in the last 3 weeks.  Patient has no vomiting or fever or significant abdominal pain.  Patient states that he has some loose stools from his ostomy and has some drainage around his drain site.  The history is provided by the patient.    Past Medical History:  Diagnosis Date  . Arthritis    bad back, pinched nerve  . Asymmetrical sensorineural hearing loss 2012   Right ear (odd appearance), s/p eval by ENT and MRI, never followed up.  will recommend pt f/u.  Marland Kitchen Cancer ([place])   . Depression   . History of chicken pox   . Hx: UTI (urinary tract infection)   . Smoker     Patient Active Problem List   Diagnosis Date Noted  . Does not have health insurance 09/01/2017  . Alcohol use 09/01/2017  . Thrombocytosis ([place]) 08/31/2017  . Severe protein-calorie malnutrition ([place]) 08/31/2017  . Memory impairment 08/31/2017  . Empyema ([place]) 08/11/2017  . Back pain 09/15/2011  . Tick bite 03/12/2011  . Healthcare maintenance 03/12/2011  . Arthritis   . Smoker   . COPD (chronic obstructive pulmonary disease) ([place]) 03/10/2011    Past Surgical History:  Procedure Laterality Date  . CATARACT  EXTRACTION Bilateral 2010  . 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  scattere small foci of hyperintensity (sequela or chronic small vessel ischemia vs demyelinating disorder, mild fluid R mastoid air cells, paranasal sinus disease        Home Medications    Prior to Admission medications   Medication Sig Start Date End Date Taking? Authorizing Provider  acetaminophen (TYLENOL) 500 MG tablet Take 2 tablets by mouth 2 (two) times daily as needed for pain. 08/04/18  Yes [provider]  donepezil (ARICEPT) 5 MG tablet Take 1 tablet by mouth at bedtime.   Yes [provider]  levothyroxine (SYNTHROID, LEVOTHROID) 25 MCG tablet Take 1 tablet by mouth every morning.   Yes [provider]  memantine (NAMENDA) 10 MG tablet Take 1 tablet by mouth at bedtime.   Yes [provider]  Multiple Vitamin (MULTI-VITAMINS) TABS Take by mouth.   Yes [provider]    Family History Family History  Problem Relation Age of Onset  . Hypertension Mother   . Diabetes Mother   . Cancer Mother 86       CLL  . Stroke Paternal Grandmother   . CAD Paternal Grandfather 61       MI?  Marland Kitchen Cancer Maternal Uncle        pancreatic  .  Coronary artery disease Neg Hx     Social History Social History   Tobacco Use  . Smoking status: Current Every Day Smoker    Packs/day: 1.00    Years: 42.00    Pack years: 42.00    Types: Cigarettes  . Smokeless tobacco: Never Used  Substance Use Topics  . Alcohol use: Yes    Comment: 2 beers/day  . Drug use: No     Allergies   Patient has no known allergies.   Review of Systems Review of Systems  Constitutional: Positive for appetite change, fatigue and unexpected weight change.  All other systems reviewed and are negative.    Physical Exam Updated Vital Signs BP 115/69   Pulse 69   Temp 98.2 F (36.8 C) (Oral)   Resp 15   Ht 5\' 8"   (1.727 m)   Wt 51.5 kg   SpO2 99%   BMI 17.26 kg/m   Physical Exam Vitals signs and nursing note reviewed.  Constitutional:      Comments: Cachetic, dehydrated   HENT:     Head: Normocephalic.     Right Ear: Tympanic membrane normal.     Left Ear: Tympanic membrane normal.     Nose: Nose normal.     Mouth/Throat:     Mouth: Mucous membranes are dry.  Eyes:     Extraocular Movements: Extraocular movements intact.     Pupils: Pupils are equal, round, and reactive to light.  Neck:     Musculoskeletal: Normal range of motion.  Cardiovascular:     Rate and Rhythm: Normal rate.  Pulmonary:     Effort: Pulmonary effort is normal.     Breath sounds: Normal breath sounds.  Abdominal:     Comments: Complicated abdominal wounds. There is osteomy in place with stool, there are multiple previous drain sites with some drainage. Mild diffuse tenderness and distention   Musculoskeletal: Normal range of motion.  Skin:    General: Skin is warm.     Capillary Refill: Capillary refill takes less than 2 seconds.  Neurological:     General: No focal deficit present.     Mental Status: He is oriented to person, place, and time.  Psychiatric:        Mood and Affect: Mood normal.      ED Treatments / Results  Labs (all labs ordered are listed, but only abnormal results are displayed) Labs Reviewed  CBC WITH DIFFERENTIAL/PLATELET - Abnormal; Notable for the following components:      Result Value   RBC 3.95 (*)    Hemoglobin 12.0 (*)    HCT 37.0 (*)    Platelets 807 (*)    All other components within normal limits  COMPREHENSIVE METABOLIC PANEL - Abnormal; Notable for the following components:   Sodium 129 (*)    Chloride 97 (*)    CO2 20 (*)    Glucose, Bld 109 (*)    Calcium 8.6 (*)    Albumin 2.8 (*)    AST 42 (*)    Alkaline Phosphatase 207 (*)    All other components within normal limits  LIPASE, BLOOD  URINALYSIS, COMPLETE (UACMP) WITH MICROSCOPIC     EKG None  Radiology Ct Abdomen Pelvis W Contrast  Result Date: 08/28/2018 CLINICAL DATA:  Ileostomy placed in November of this year. Dehydration. Dementia. Rectal cancer. Low anterior resection. EXAM: CT ABDOMEN AND PELVIS WITH CONTRAST TECHNIQUE: Multidetector CT imaging of the abdomen and pelvis was performed using the standard protocol  following bolus administration of intravenous contrast. CONTRAST:  49mL ISOVUE-300 IOPAMIDOL (ISOVUE-300) INJECTION 61% COMPARISON:  08/09/2018. 08/15/2017. FINDINGS: Lower chest: Multiple pulmonary nodules at the lung bases consistent with metastatic disease as seen previously. Further enlargement of the right lower lobe dominant nodule, now measuring 14 mm in diameter as opposed to 12 mm previously. Hepatobiliary: 9 mm low-density in the right lobe image 18 could be evidence of hepatic metastatic disease. No calcified gallstones. Loculated collections along the lateral margin and tip of the abdomen consistent with chronic abscess. Collection at the liver tip level measures 6.2 x 3.7 cm in diameter. Pancreas: Normal Spleen: Normal Adrenals/Urinary Tract: Adrenal glands are normal. Kidneys are normal. Bladder contains a small amount of air, presumably secondary to catheterization. Stomach/Bowel: No sign of bowel obstruction. Right lower quadrant ileostomy. Few other small interloop collections, the largest in the central pelvis measuring 24 x 18 mm. Two drainage catheters are apparently placed per rectum, passing through the previously seen suture line into the extraluminal pelvic cul de sac. Small amount of residual fluid and air in the pelvic cul de sac/perirectal region presumed represent abscess. This measures up to 3.3 cm in size. Vascular/Lymphatic: Aortic atherosclerosis. No aneurysm. No retroperitoneal adenopathy. Reproductive: Normal Other: None Musculoskeletal: Chronic lumbar degenerative changes. IMPRESSION: 1. Multiple pulmonary nodules at the lung bases  consistent with metastatic disease. Further enlargement of a dominant nodule in the right lower lobe, now measuring 14 mm in diameter as opposed to 12 mm previously. 2. Loculated collections along the lateral margin and tip of the abdomen consistent with chronic abscess. 3. Two drainage catheters are present placed by rectum, apparently passing through the previously seen suture line into the extraluminal pelvic cul de sac. Small amount of residual fluid and air in the pelvic cul de sac/perirectal region presumed to represent abscess. This measures up to 3.3 cm in size. 4. 9 mm low-density in the right lobe of the liver that could be evidence of hepatic metastatic disease. Electronically Signed   By: Nelson Chimes M.D.   On: 08/28/2018 10:38    Procedures Procedures (including critical care time)  Medications Ordered in ED Medications  sodium chloride 0.9 % bolus 1,000 mL (0 mLs Intravenous Stopped 08/28/18 0918)  ondansetron (ZOFRAN) injection 4 mg (4 mg Intravenous Given 08/28/18 0758)  iopamidol (ISOVUE-300) 61 % injection 30 mL (30 mLs Oral Contrast Given 08/28/18 0810)  iopamidol (ISOVUE-300) 61 % injection 100 mL (80 mLs Intravenous Contrast Given 08/28/18 1013)     Initial Impression / Assessment and Plan / ED Course  I have reviewed the triage vital signs and the nursing notes.  Pertinent labs & imaging results that were available during my care of the patient were reviewed by me and considered in my medical decision making (see chart for details).    BAILEY KOLBE is a 62 y.o. male here presenting with persistent nausea and weight loss.  Patient was recently hospitalized for intra-abdominal abscess after an ostomy placement.  I am concerned for recurrent abscess versus other surgical complications.  Will get labs and repeat CT abdomen pelvis and hydrate and reassess.   12 pm Patient's sodium is 129, was 127 last night. CT showed chronic abscess that is stable. Patient tolerated PO  fluid in the ED and tolerated PO contrast.   2 pm I talked to Dr. Carlis Abbott from surgery at [facility] regarding CT findings. Patient was seen by IR 2 days ago at [facility] and has follow up with Dr. Drue Flirt  on 1/3. The CT findings were improved from before he had the drain. Sister then states that he has been pulling on his ostomy and dementia seemed to be getting worse. She states that she can't take care of him. Social work saw patient and states that patient can be placed in a facility so I signed the FL2 form.   2:56 PM Patient has a bed at [facility] Stable for transfer.   Final Clinical Impressions(s) / ED Diagnoses   Final diagnoses:  None    ED Discharge Orders    None       Drenda Freeze, MD 08/28/18 1456

## 2018-08-28 NOTE — Clinical Social Work Note (Signed)
CSW notified by [facility] that patient needs to be place in SNF. CSW met with patient and sister Tamsen Meek at bedside. CSW explained that the only bed offer available is at [facility] and since there is not a reason to admit him that we would have to discharge either home or to [facility] today. Sister in agreement with discharge to [facility] today. CSW notified RN of discharge plan. Patient will be transported by EMS.   [place], [place]

## 2018-08-28 NOTE — Care Management Note (Addendum)
Case Management Note  Patient Details  Name: Adam Boyd MRN: [id number] Date of Birth: 11-18-1955  Subjective/Objective:   Patient is being seen in the ED for dehydration, pt's sister, Tamsen Meek, brought him in and reports that he has not been eating or drinking and has lost a significant amount of weight.   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  The patient has a history of dementia, the sister reports that he wakes up throughout the night and pulls off the ostomy bag and she is not getting any sleep.   Sister reports that the patient does not understand what the bag is, and why he has to have it.  Patient's sister wants the patient to get better, back to his normal strength and normal state of health.  Patient's sister is interested in temporary placement for her brother.  CSW consult placed for placement.  Patient does go to the [facility] in [place].  RNCM contacted [facility] in [place] to notify them of patient's visit today.  [facility] and patient social worker with the [facility] is [place] number is [phone] ext [phone], pager [phone].  Patient is open with [place] for [facility] RN.  Floydene Flock with [facility] aware pt is being seen in the ED.  ED physician notified of RNCM and LCSW plan, there is no medical need for the patient to be admitted to the hospital.  RNCM will cont to follow. Doran Clay RN BSN [phone]    Action/Plan:   Expected Discharge Date:                  Expected Discharge Plan:     In-House Referral:     Discharge planning Services     Post Acute Care Choice:    Choice offered to:     DME Arranged:    DME Agency:     HH Arranged:    HH Agency:     Status of Service:     If discussed at [place] of Stay Meetings, dates discussed:    Additional Comments:  Shelbie Hutching, RN 08/28/2018, 12:07 PM

## 2018-08-28 NOTE — ED Notes (Signed)
Social work at bedside.  

## 2018-08-28 NOTE — ED Notes (Signed)
Patient out of ED via [facility]. Colostomy bag emptied prior to leaving.

## 2018-08-28 NOTE — ED Triage Notes (Signed)
Pt brought here by sister with concern for dehydration, states he had an ileostomy placed in Nov, has not been eating or drinking well since, states he has mild dementia, feels like he needed rehab longer after his last hospitalization. States he started around 150lbs, now 113lbs. Pt in no distress at this time.

## 2018-08-28 NOTE — ED Notes (Signed)
First Nurse Note: Patient to ED via [facility] accompanied by sister.  Sister states patient has an ostomy and she feels that he is dehydrated.  Patient alert.

## 2018-08-28 NOTE — ED Notes (Signed)
Pts ostomy bag emptied.

## 2018-08-28 NOTE — NC FL2 (Signed)
  [place] LEVEL OF CARE SCREENING TOOL     IDENTIFICATION  Patient Name: Adam Boyd Birthdate: April 23, 1956 Sex: male Admission Date (Current Location): 08/28/2018  [place] and [place] Number:  Engineering geologist and Address:  [facility], [address], [place], [place] [address]      Provider Number: [id number]  Attending Physician Name and Address:  Drenda Freeze, MD  Relative Name and Phone Number:       Current Level of Care: Hospital Recommended Level of Care: [place] Prior Approval Number:    Date Approved/Denied:   PASRR Number: [id number]  Discharge Plan: SNF    Current Diagnoses: Patient Active Problem List   Diagnosis Date Noted  . Does not have health insurance 09/01/2017  . Alcohol use 09/01/2017  . Thrombocytosis ([place]) 08/31/2017  . Severe protein-calorie malnutrition ([place]) 08/31/2017  . Memory impairment 08/31/2017  . Empyema ([place]) 08/11/2017  . Back pain 09/15/2011  . Tick bite 03/12/2011  . Healthcare maintenance 03/12/2011  . Arthritis   . Smoker   . COPD (chronic obstructive pulmonary disease) ([place]) 03/10/2011    Orientation RESPIRATION BLADDER Height & Weight     Self, Place  Normal Continent Weight: 113 lb 8 oz (51.5 kg) Height:  5\' 8"  (172.7 cm)  BEHAVIORAL SYMPTOMS/MOOD NEUROLOGICAL BOWEL NUTRITION STATUS  (none) (none) Ileostomy Diet(regular)  AMBULATORY STATUS COMMUNICATION OF NEEDS Skin   Extensive Assist Verbally Normal                       Personal Care Assistance Level of Assistance  Bathing, Feeding, Dressing Bathing Assistance: Limited assistance Feeding assistance: Independent Dressing Assistance: Limited assistance     Functional Limitations Info  Sight, Hearing, Speech Sight Info: Adequate Hearing Info: Adequate Speech Info: Adequate    SPECIAL CARE FACTORS FREQUENCY                       Contractures Contractures Info: Not  present    Additional Factors Info  Code Status, Allergies Code Status Info: Full Code  Allergies Info: NKA           Current Medications (08/28/2018):  This is the current hospital active medication list No current facility-administered medications for this encounter.    Current Outpatient Medications  Medication Sig Dispense Refill  . acetaminophen (TYLENOL) 500 MG tablet Take 2 tablets by mouth 2 (two) times daily as needed for pain.    Marland Kitchen donepezil (ARICEPT) 5 MG tablet Take 1 tablet by mouth at bedtime.    Marland Kitchen levothyroxine (SYNTHROID, LEVOTHROID) 25 MCG tablet Take 1 tablet by mouth every morning.    . memantine (NAMENDA) 10 MG tablet Take 1 tablet by mouth at bedtime.    . Multiple Vitamin (MULTI-VITAMINS) TABS Take by mouth.       Discharge Medications: Please see discharge summary for a list of discharge medications.  Relevant Imaging Results:  Relevant Lab Results:   Additional Information SSN: [id number]  Annamaria Boots, [place]

## 2018-08-28 NOTE — Discharge Instructions (Addendum)
Go to [facility]. Continue your current meds.   You will need your ostomy cared for at the facility. Empty ostomy when it is full.   You have follow up with your surgeon, Dr. Drue Flirt on 09/13/18. Please keep your appointment   Return to ER if you have vomiting, dehydration, severe abdominal pain, fever.

## 2018-10-10 ENCOUNTER — Ambulatory Visit: Payer: Non-veteran care | Admitting: Radiation Oncology

## 2019-05-13 DEATH — deceased

## 2020-06-02 IMAGING — CT CT ABD-PELV W/ CM
2 of 5 series · 14 of 46 positions shown, 16 images · IV contrast (APPLIED)
Comparison: CT chest 03/11/2018.

CLINICAL DATA: Loss of weight. Rectal cancer. Status post low
anterior resection of rectum with diverting colostomy.

EXAM:
CT ABDOMEN AND PELVIS WITH CONTRAST
TECHNIQUE: Multidetector CT imaging of the abdomen and pelvis was performed
using the standard protocol following bolus administration of
intravenous contrast.
CONTRAST:  100mL [id number] IOPAMIDOL ([id number]) INJECTION 61%

[Series 2: routine abd/pel with · axial · 0.77mm/px · z∈[-718,-278]mm · 11 of 102 slices shown, 13 images]
[im 7/102  soft-tissue]
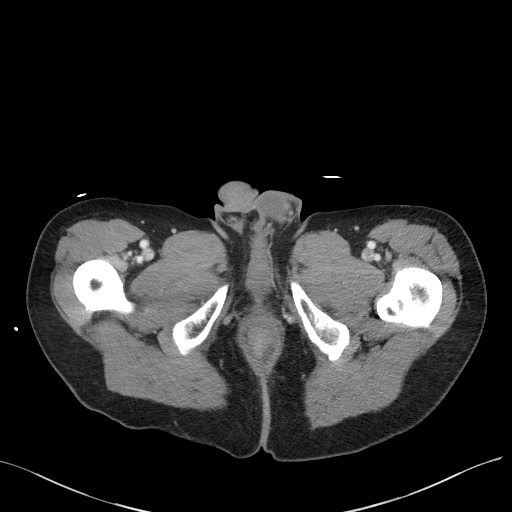
[im 7/102  bone]
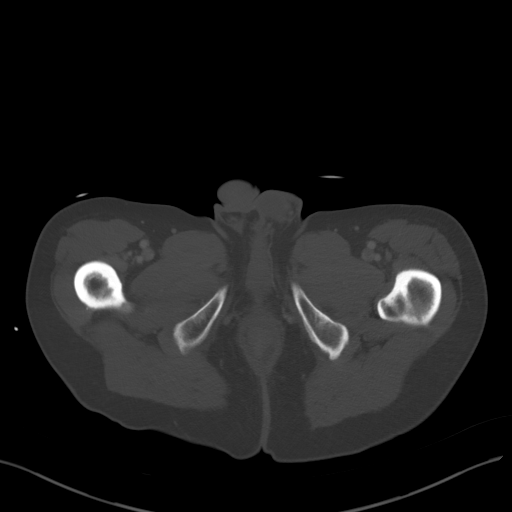
[im 14/102  soft-tissue]
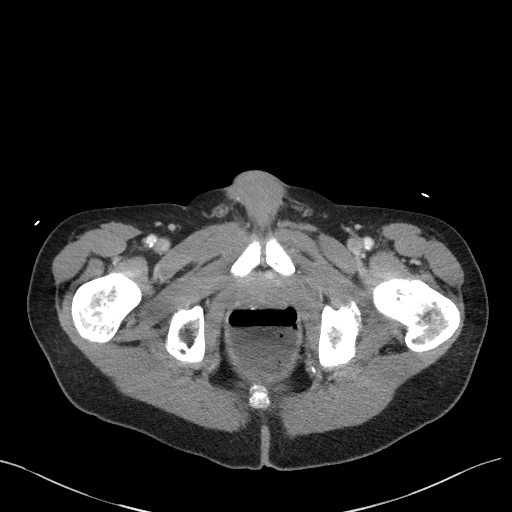
[im 27/102  soft-tissue]
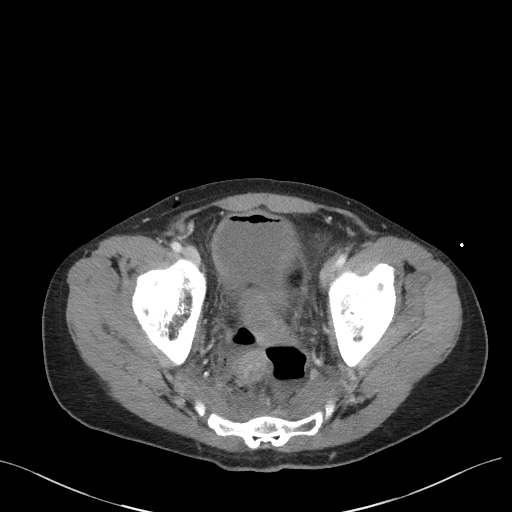
[im 34/102  soft-tissue]
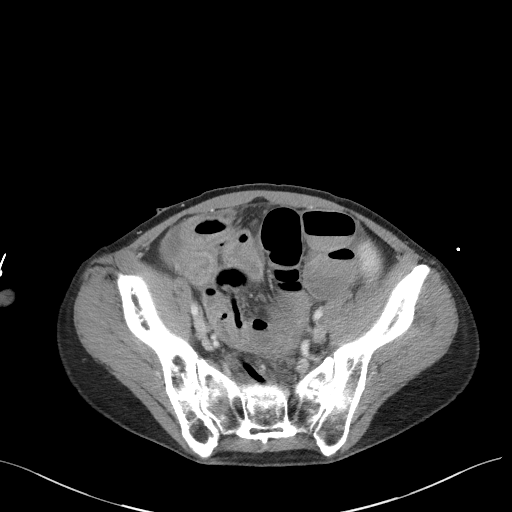
[im 41/102  soft-tissue]
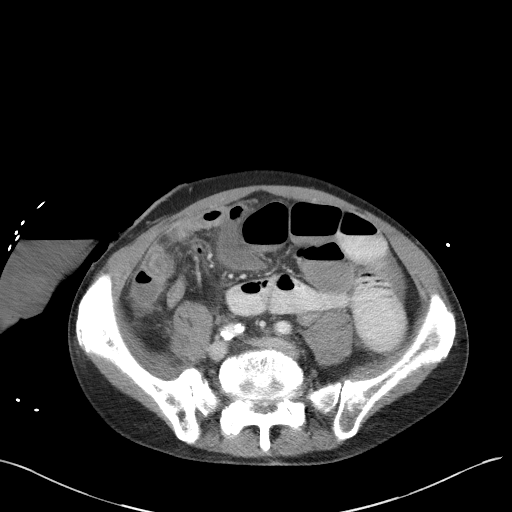
[im 54/102  soft-tissue]
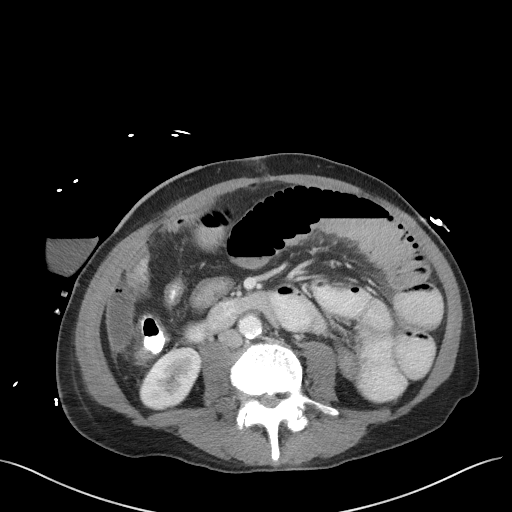
[im 61/102  soft-tissue]
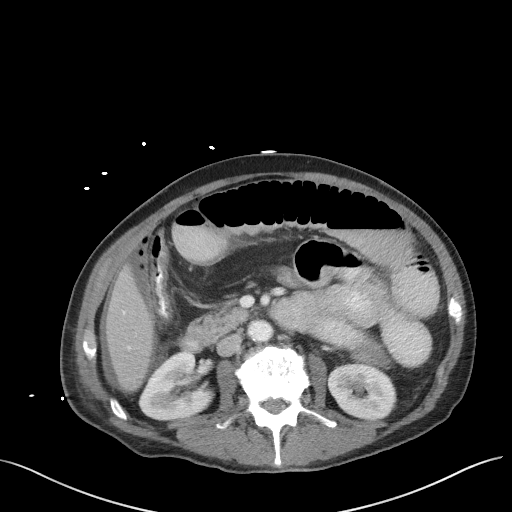
[im 68/102  soft-tissue]
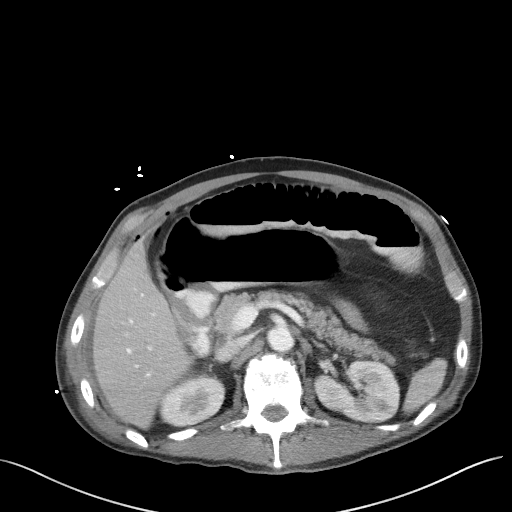
[im 75/102  soft-tissue]
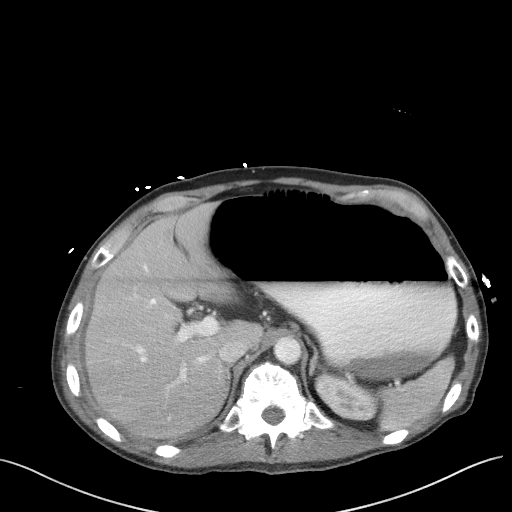
[im 75/102  bone]
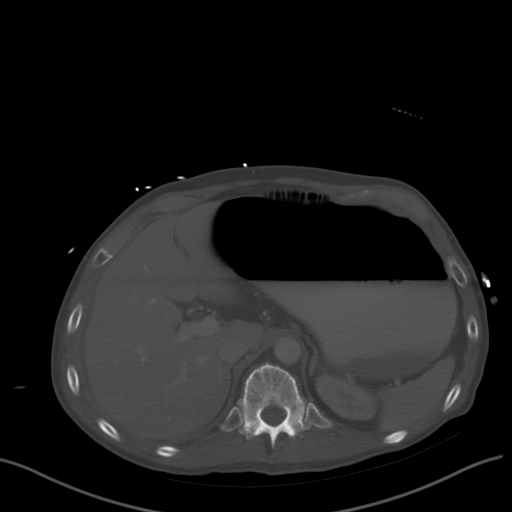
[im 88/102  soft-tissue]
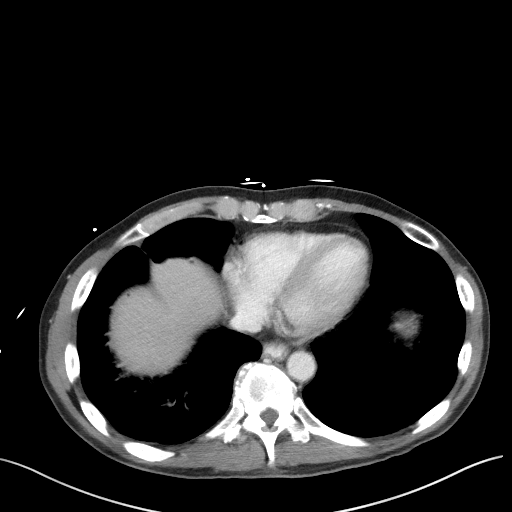
[im 95/102  soft-tissue]
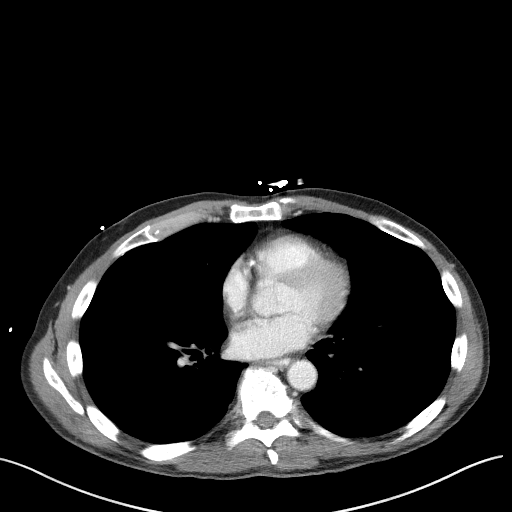

[Series 5: coronal st · coronal · 0.66mm/px · 3 of 86 slices shown]
[im 29/86  soft-tissue]
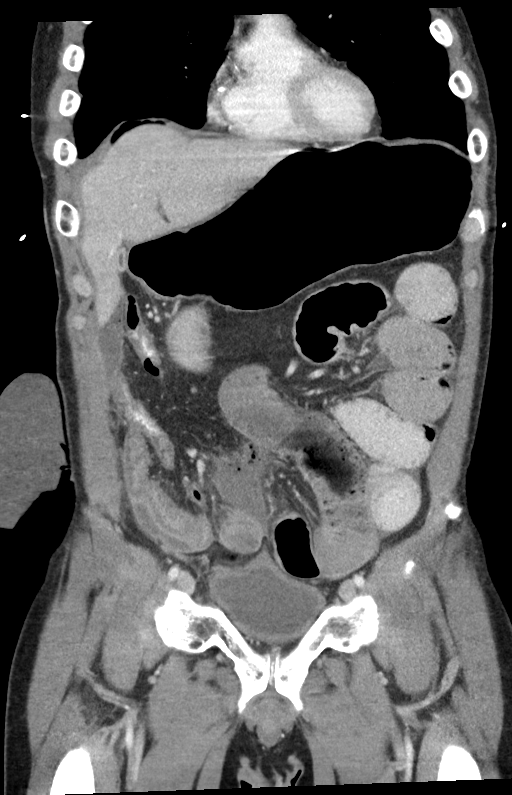
[im 38/86  soft-tissue]
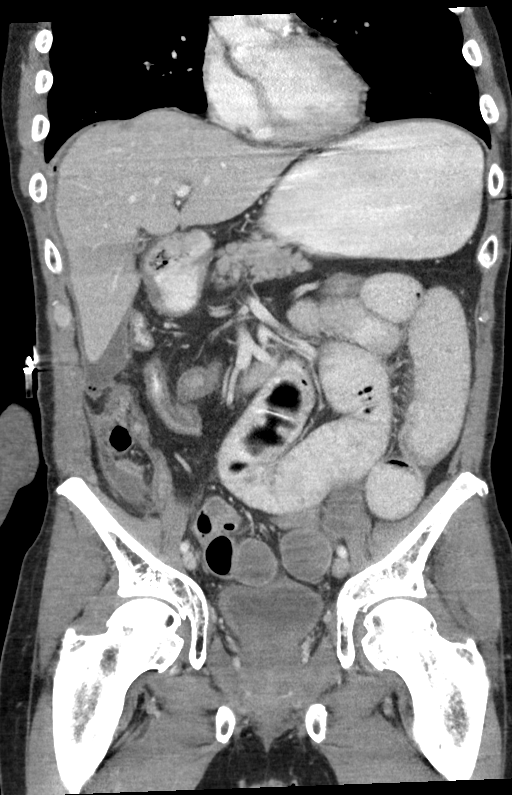
[im 48/86  soft-tissue]
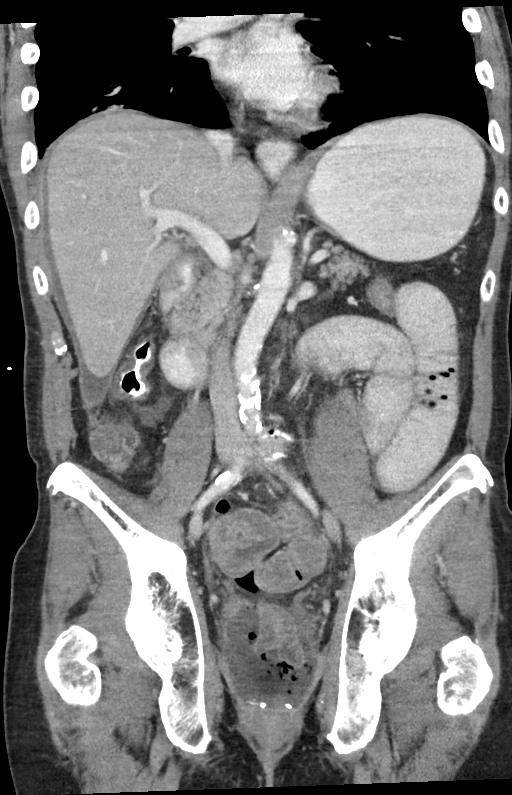

[14 of 46 positions shown; findings below may reference images not displayed]

FINDINGS: Lower chest: Multiple pulmonary nodules are identified within the
lung bases. The largest is in the posterior right lower lobe
measuring 1.2 cm, image [DATE]. Previously 0.8 cm. New nodule in the
posterior left lower lobe measures 5 mm, image [DATE]. Lateral right
lower lobe pulmonary nodule is also new measuring 3 mm, image [DATE].

Hepatobiliary: There is no focal liver abnormality. Gallbladder
appears collapsed. No biliary dilatation.

Pancreas: Unremarkable. No pancreatic ductal dilatation or
surrounding inflammatory changes.

Spleen: Normal in size without focal abnormality.

Adrenals/Urinary Tract: Normal adrenal glands. Normal appearance of
the kidneys. No mass or hydronephrosis. Gas noted within the urinary
bladder.

Stomach/Bowel: There is marked distension of the stomach. The small
bowel loops are dilated with multiple air-fluid levels. These
measure up to 4.3 cm. Transition to decreased caliber distal small
bowel loops noted within the right lower quadrant of the abdomen,
image 68/2. Distal small bowel is normal to decreased in caliber
with bowel wall edema and inflammation up to the level of the double
barrel ileostomy. Colon appears diffusely collapsed.

Within the low pelvis there appears to be dehiscence of the
anastomosis. There is there is a large fluid collection between the
distal end of the colon and the suture line. This fluid collection
contains debris and air-fluid level measuring 6.2 by 5.9 by 6.6 cm.
Surrounding areas of extraluminal gas are identified within both
sides of the pelvis.

Vascular/Lymphatic: Aortic atherosclerosis. No aneurysm. No
abdominal or pelvic adenopathy identified.

Reproductive: Prostate is unremarkable.

Other: There is a fluid collection which extends over the right lobe
of liver containing gas, worrisome for abscess. The large is
component extends along the inferior right hepatic lobe measuring
5.7 x 3.5 by 2.6 cm. A second fluid collection is identified within
the right lower quadrant of the abdomen and partially obscures
surrounds inflamed distal small bowel loops. This measures 5.5 x
by 6.0 cm. Within the right lower quadrant mesentery there is a
fluid collection measuring 4.8 by 2.7 by 4.8 cm.

Musculoskeletal: Spondylosis noted within the lumbar spine. Suspect
Paget's disease of the right iliac bone.
IMPRESSION: 1. The distal colon appears to have separated from the suture line
within the lower pelvis. This is worrisome for distal anastomotic
dehiscence.
2. Large fluid collection containing air-fluid level and debris
surrounds the dehiscence site worrisome for abscess. Three
additional fluid collections are noted involving the right hepatic
lobe, right lower quadrant small bowel loops and right lower
quadrant mesentery.
3. Abnormal small bowel dilatation with transition point in the
right lower quadrant of the abdomen. Findings may represent either
small bowel obstruction or small-bowel ileus.
4. Pneumoperitoneum is identified. This may be either secondary to
anastomotic dehiscence or residual postoperative changes.
5. Progression of pulmonary nodularity compared with 03/11/2018.
Suspicious for metastatic disease.

## 2020-06-02 IMAGING — CR DG CHEST 2V
2 series · 2 of 2 positions shown · non-contrast
Comparison: 09/14/2017

CLINICAL DATA: Altered mental status with low-grade fever.

EXAM:
CHEST - 2 VIEW

[chest lat]
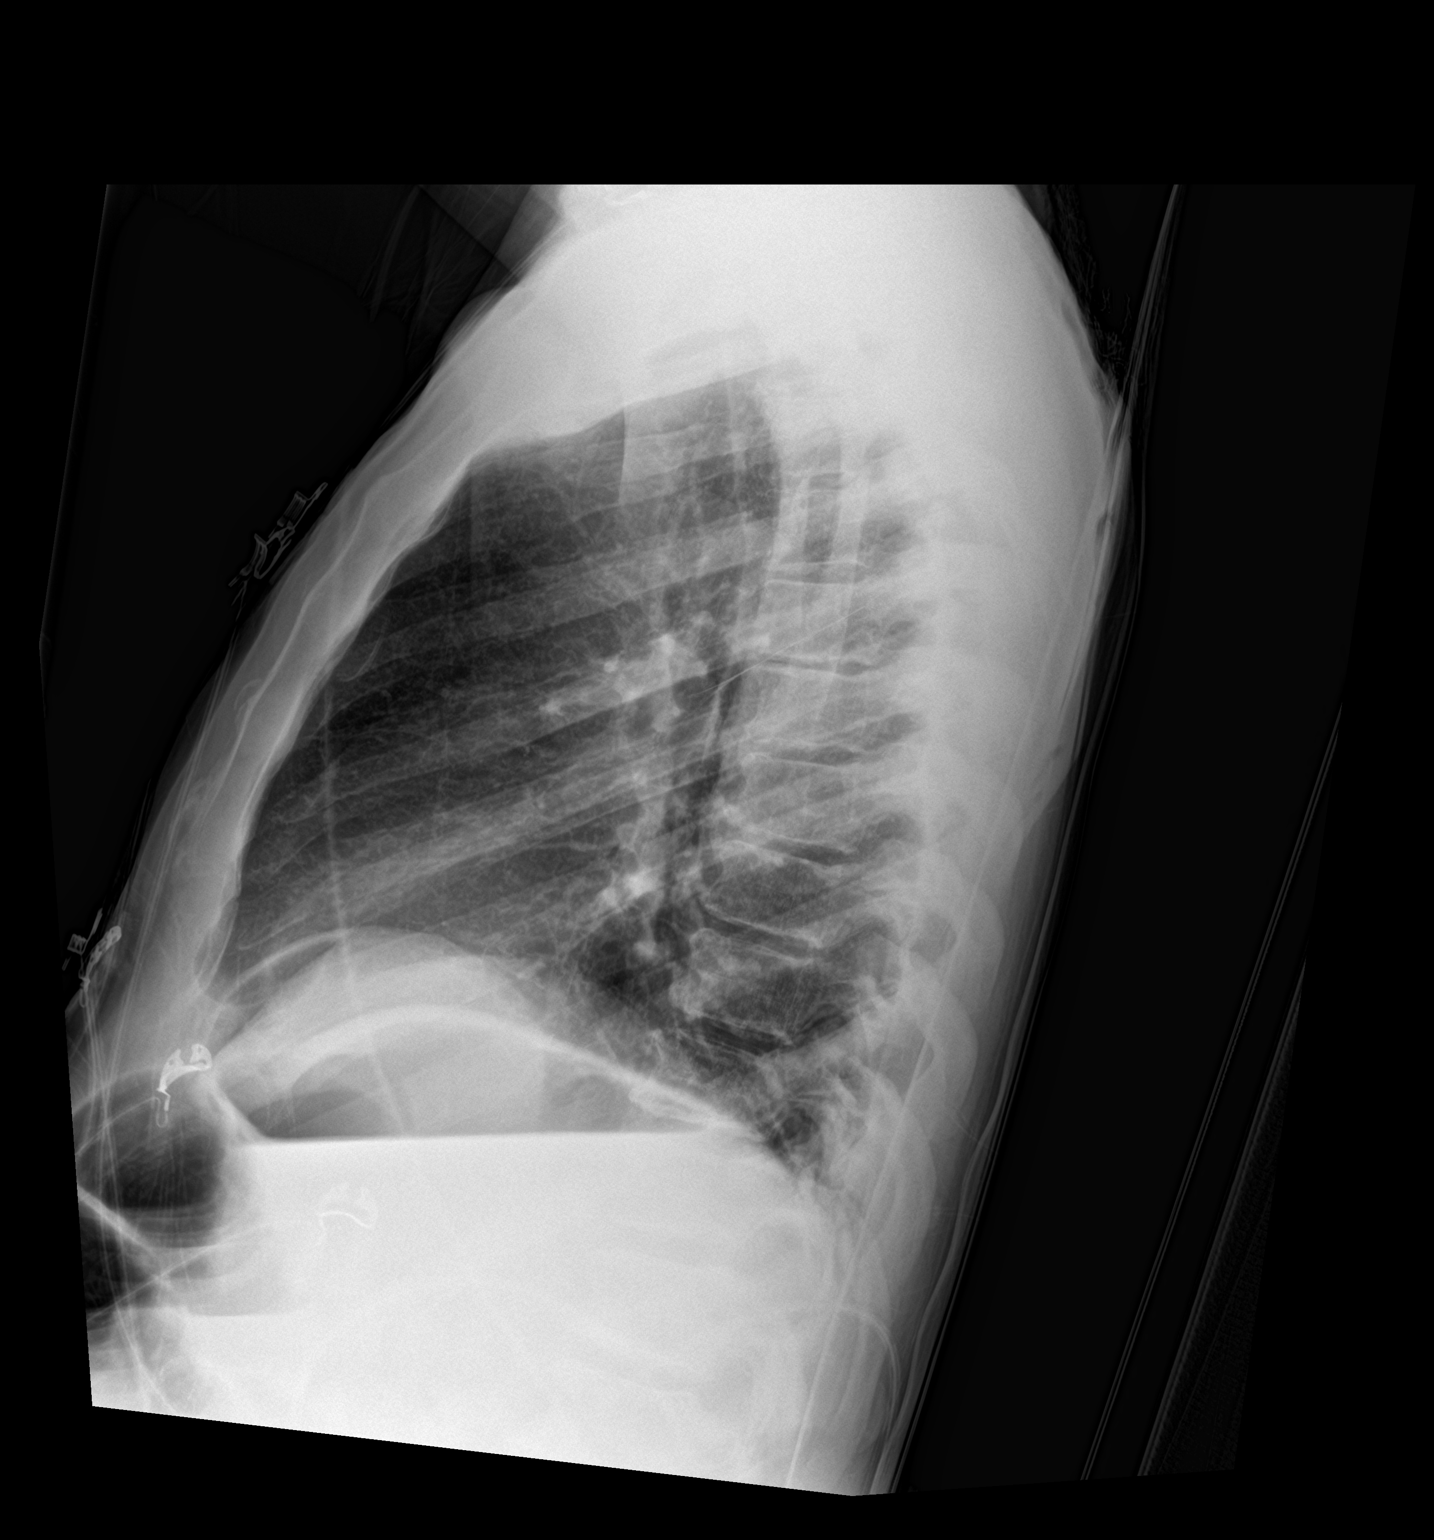

[chest ap]
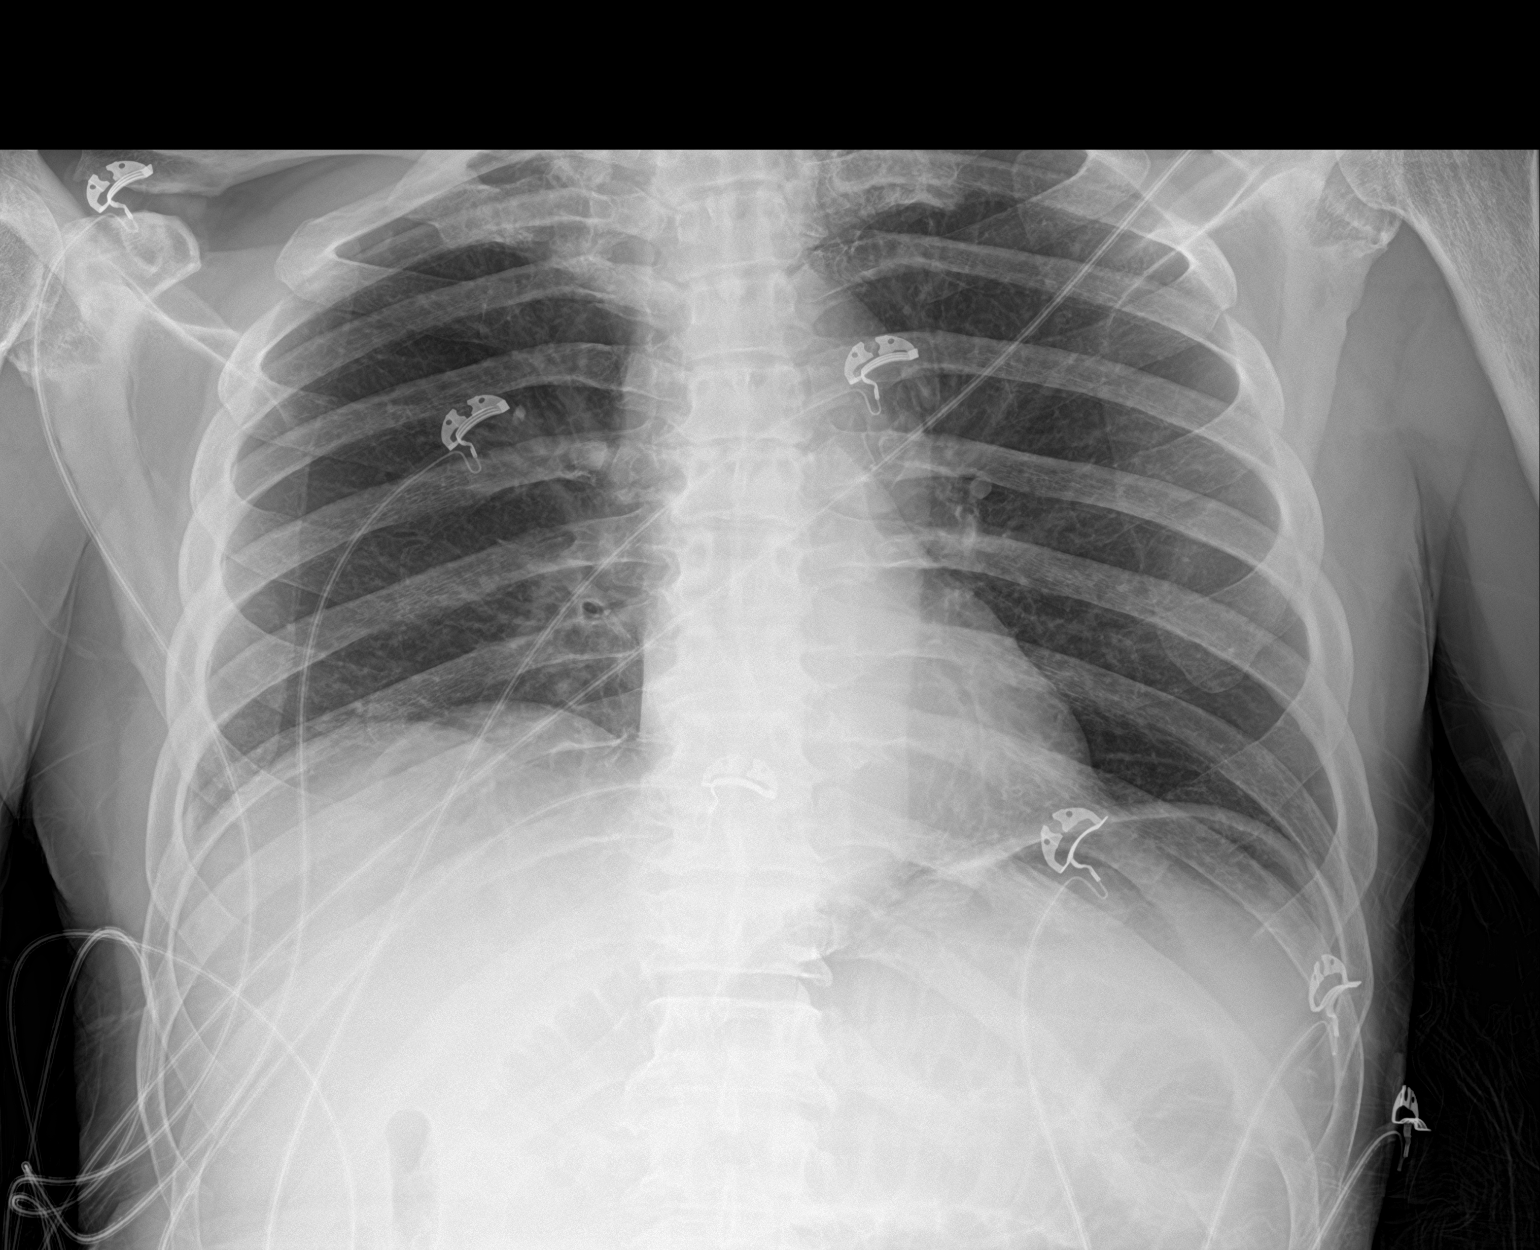

[2 of 2 positions shown; findings below may reference images not displayed]

FINDINGS: The heart size and mediastinal contours are within normal limits.
Both lungs are clear. The visualized skeletal structures are
unremarkable.
IMPRESSION: No active cardiopulmonary disease.
